# Patient Record
Sex: Male | Born: 1940 | Hispanic: No | Marital: Married
Health system: Southern US, Community
[De-identification: ages and names within clinical notes are randomized; demographics above are authoritative.]

---

## 2004-09-28 ENCOUNTER — Emergency Department: Payer: Self-pay | Admitting: Internal Medicine

## 2004-10-27 ENCOUNTER — Emergency Department: Payer: Self-pay | Admitting: Unknown Physician Specialty

## 2005-10-18 ENCOUNTER — Emergency Department: Payer: Self-pay | Admitting: Internal Medicine

## 2005-10-18 ENCOUNTER — Other Ambulatory Visit: Payer: Self-pay

## 2005-10-21 ENCOUNTER — Emergency Department: Payer: Self-pay | Admitting: Emergency Medicine

## 2005-11-02 ENCOUNTER — Emergency Department: Payer: Self-pay | Admitting: Emergency Medicine

## 2005-11-19 ENCOUNTER — Emergency Department: Payer: Self-pay | Admitting: Emergency Medicine

## 2005-12-18 ENCOUNTER — Emergency Department: Payer: Self-pay | Admitting: Emergency Medicine

## 2006-10-28 ENCOUNTER — Inpatient Hospital Stay: Payer: Self-pay | Admitting: Vascular Surgery

## 2007-04-10 ENCOUNTER — Emergency Department: Payer: Self-pay | Admitting: Emergency Medicine

## 2007-04-18 ENCOUNTER — Emergency Department: Payer: Self-pay | Admitting: Internal Medicine

## 2007-05-30 ENCOUNTER — Emergency Department: Payer: Self-pay | Admitting: Emergency Medicine

## 2007-09-25 ENCOUNTER — Other Ambulatory Visit: Payer: Self-pay

## 2007-09-25 ENCOUNTER — Emergency Department: Payer: Self-pay | Admitting: Emergency Medicine

## 2007-09-28 ENCOUNTER — Other Ambulatory Visit: Payer: Self-pay

## 2007-09-28 ENCOUNTER — Inpatient Hospital Stay: Payer: Self-pay | Admitting: Internal Medicine

## 2008-01-28 ENCOUNTER — Emergency Department: Payer: Self-pay | Admitting: Emergency Medicine

## 2008-02-03 ENCOUNTER — Emergency Department: Payer: Self-pay | Admitting: Emergency Medicine

## 2008-02-05 ENCOUNTER — Inpatient Hospital Stay: Payer: Self-pay | Admitting: Internal Medicine

## 2008-09-17 ENCOUNTER — Inpatient Hospital Stay: Payer: Self-pay | Admitting: Internal Medicine

## 2008-09-18 ENCOUNTER — Ambulatory Visit: Payer: Self-pay | Admitting: Cardiovascular Disease

## 2009-05-02 ENCOUNTER — Emergency Department: Payer: Self-pay | Admitting: Emergency Medicine

## 2009-05-13 ENCOUNTER — Emergency Department: Payer: Self-pay | Admitting: Internal Medicine

## 2009-05-29 ENCOUNTER — Emergency Department: Payer: Self-pay | Admitting: Emergency Medicine

## 2009-12-12 ENCOUNTER — Inpatient Hospital Stay: Payer: Self-pay | Admitting: Internal Medicine

## 2010-01-12 ENCOUNTER — Emergency Department: Payer: Self-pay | Admitting: Emergency Medicine

## 2010-02-27 ENCOUNTER — Emergency Department: Payer: Self-pay | Admitting: Emergency Medicine

## 2010-03-22 ENCOUNTER — Emergency Department: Payer: Self-pay | Admitting: Internal Medicine

## 2010-08-02 ENCOUNTER — Emergency Department: Payer: Self-pay | Admitting: Emergency Medicine

## 2010-08-21 ENCOUNTER — Emergency Department: Payer: Self-pay | Admitting: Emergency Medicine

## 2010-08-27 ENCOUNTER — Emergency Department: Payer: Self-pay | Admitting: Emergency Medicine

## 2011-03-05 ENCOUNTER — Emergency Department: Payer: Self-pay | Admitting: Emergency Medicine

## 2011-04-19 ENCOUNTER — Inpatient Hospital Stay: Payer: Self-pay | Admitting: Internal Medicine

## 2011-10-11 ENCOUNTER — Emergency Department: Payer: Self-pay | Admitting: Unknown Physician Specialty

## 2011-10-11 LAB — URINALYSIS, COMPLETE
Bacteria: NONE SEEN
Bilirubin,UR: NEGATIVE
Leukocyte Esterase: NEGATIVE
Ph: 5 (ref 4.5–8.0)
RBC,UR: 14 /HPF (ref 0–5)
Squamous Epithelial: <1
WBC UR: 1 /HPF (ref 0–5)

## 2011-10-11 LAB — CBC
HGB: 13.5 g/dL (ref 13.0–18.0)
MCH: 35.2 pg — ABNORMAL HIGH (ref 26.0–34.0)
RBC: 3.84 10*6/uL — ABNORMAL LOW (ref 4.40–5.90)
RDW: 12.2 % (ref 11.5–14.5)
WBC: 3.6 10*3/uL — ABNORMAL LOW (ref 3.8–10.6)

## 2011-10-11 LAB — COMPREHENSIVE METABOLIC PANEL
Alkaline Phosphatase: 55 U/L (ref 50–136)
BUN: 16 mg/dL (ref 7–18)
Bilirubin,Total: 1.1 mg/dL — ABNORMAL HIGH (ref 0.2–1.0)
Chloride: 93 mmol/L — ABNORMAL LOW (ref 98–107)
Co2: 29 mmol/L (ref 21–32)
Creatinine: 1.04 mg/dL (ref 0.60–1.30)
EGFR (African American): >60
EGFR (Non-African Amer.): >60
Osmolality: 268 (ref 275–301)
Sodium: 133 mmol/L — ABNORMAL LOW (ref 136–145)
Total Protein: 8.3 g/dL — ABNORMAL HIGH (ref 6.4–8.2)

## 2011-10-11 LAB — LIPASE, BLOOD: Lipase: 162 U/L (ref 73–393)

## 2012-01-08 ENCOUNTER — Ambulatory Visit: Payer: Self-pay | Admitting: Otolaryngology

## 2012-01-25 ENCOUNTER — Emergency Department: Payer: Self-pay | Admitting: Emergency Medicine

## 2012-01-25 LAB — BASIC METABOLIC PANEL
BUN: 12 mg/dL (ref 7–18)
Calcium, Total: 8.9 mg/dL (ref 8.5–10.1)
Co2: 31 mmol/L (ref 21–32)
EGFR (African American): >60
EGFR (Non-African Amer.): >60
Glucose: 81 mg/dL (ref 65–99)
Potassium: 2.6 mmol/L — ABNORMAL LOW (ref 3.5–5.1)

## 2012-01-25 LAB — CBC
HGB: 13.5 g/dL (ref 13.0–18.0)
MCHC: 34.9 g/dL (ref 32.0–36.0)
MCV: 99 fL (ref 80–100)
Platelet: 250 10*3/uL (ref 150–440)
RDW: 12.1 % (ref 11.5–14.5)
WBC: 3.2 10*3/uL — ABNORMAL LOW (ref 3.8–10.6)

## 2012-02-04 ENCOUNTER — Ambulatory Visit: Payer: Self-pay | Admitting: Otolaryngology

## 2012-02-04 DIAGNOSIS — I10 Essential (primary) hypertension: Secondary | ICD-10-CM

## 2012-02-04 LAB — ELECTROLYTE PANEL
Anion Gap: 8 (ref 7–16)
Chloride: 90 mmol/L — ABNORMAL LOW (ref 98–107)
Sodium: 130 mmol/L — ABNORMAL LOW (ref 136–145)

## 2012-02-15 ENCOUNTER — Ambulatory Visit: Payer: Self-pay | Admitting: Otolaryngology

## 2012-02-16 LAB — PATHOLOGY REPORT

## 2012-02-19 ENCOUNTER — Ambulatory Visit: Payer: Self-pay | Admitting: Otolaryngology

## 2012-02-23 ENCOUNTER — Ambulatory Visit: Payer: Self-pay | Admitting: Otolaryngology

## 2012-03-09 ENCOUNTER — Observation Stay: Payer: Self-pay | Admitting: Internal Medicine

## 2012-03-09 LAB — URINALYSIS, COMPLETE
Bacteria: NONE SEEN
Bilirubin,UR: NEGATIVE
Bilirubin,UR: NEGATIVE
Glucose,UR: NEGATIVE mg/dL (ref 0–75)
Nitrite: NEGATIVE
Nitrite: NEGATIVE
Ph: 7 (ref 4.5–8.0)
Protein: NEGATIVE
Squamous Epithelial: <1
Squamous Epithelial: <1
WBC UR: 11 /HPF (ref 0–5)
WBC UR: 91 /HPF (ref 0–5)

## 2012-03-09 LAB — CBC
HCT: 37.4 % — ABNORMAL LOW (ref 40.0–52.0)
HGB: 13 g/dL (ref 13.0–18.0)
MCH: 33.7 pg (ref 26.0–34.0)
MCHC: 34.7 g/dL (ref 32.0–36.0)
MCV: 97 fL (ref 80–100)
Platelet: 242 10*3/uL (ref 150–440)
RBC: 3.85 10*6/uL — ABNORMAL LOW (ref 4.40–5.90)
RDW: 13.1 % (ref 11.5–14.5)
WBC: 4.7 10*3/uL (ref 3.8–10.6)

## 2012-03-09 LAB — TSH: Thyroid Stimulating Horm: 4.47 u[IU]/mL

## 2012-03-09 LAB — COMPREHENSIVE METABOLIC PANEL
Alkaline Phosphatase: 84 U/L (ref 50–136)
BUN: 11 mg/dL (ref 7–18)
Bilirubin,Total: 1.1 mg/dL — ABNORMAL HIGH (ref 0.2–1.0)
Calcium, Total: 8.9 mg/dL (ref 8.5–10.1)
Chloride: 77 mmol/L — ABNORMAL LOW (ref 98–107)
Co2: 31 mmol/L (ref 21–32)
Creatinine: 1.06 mg/dL (ref 0.60–1.30)
EGFR (African American): >60
EGFR (Non-African Amer.): >60
Osmolality: 245 (ref 275–301)
Potassium: 2.5 mmol/L — CL (ref 3.5–5.1)
SGOT(AST): 56 U/L — ABNORMAL HIGH (ref 15–37)
Total Protein: 7.9 g/dL (ref 6.4–8.2)

## 2012-03-10 LAB — BASIC METABOLIC PANEL
Anion Gap: 6 — ABNORMAL LOW (ref 7–16)
Calcium, Total: 8.7 mg/dL (ref 8.5–10.1)
Chloride: 91 mmol/L — ABNORMAL LOW (ref 98–107)
Co2: 33 mmol/L — ABNORMAL HIGH (ref 21–32)
EGFR (African American): >60
EGFR (Non-African Amer.): >60
Osmolality: 260 (ref 275–301)
Potassium: 3.6 mmol/L (ref 3.5–5.1)
Sodium: 130 mmol/L — ABNORMAL LOW (ref 136–145)

## 2012-03-10 LAB — CBC WITH DIFFERENTIAL/PLATELET
Basophil %: 0.3 %
Eosinophil %: 0.7 %
HCT: 37.1 % — ABNORMAL LOW (ref 40.0–52.0)
HGB: 12.7 g/dL — ABNORMAL LOW (ref 13.0–18.0)
Lymphocyte #: 0.4 10*3/uL — ABNORMAL LOW (ref 1.0–3.6)
Lymphocyte %: 11.4 %
Monocyte #: 0.8 x10 3/mm (ref 0.2–1.0)
Monocyte %: 19.4 %
Neutrophil #: 2.7 10*3/uL (ref 1.4–6.5)
Neutrophil %: 68.2 %
Platelet: 230 10*3/uL (ref 150–440)
WBC: 3.9 10*3/uL (ref 3.8–10.6)

## 2012-03-10 LAB — MAGNESIUM: Magnesium: 1.9 mg/dL

## 2012-03-11 LAB — BASIC METABOLIC PANEL
Anion Gap: 7 (ref 7–16)
BUN: 14 mg/dL (ref 7–18)
Calcium, Total: 8.4 mg/dL — ABNORMAL LOW (ref 8.5–10.1)
Chloride: 95 mmol/L — ABNORMAL LOW (ref 98–107)
Co2: 30 mmol/L (ref 21–32)
EGFR (Non-African Amer.): >60
Glucose: 99 mg/dL (ref 65–99)
Osmolality: 265 (ref 275–301)
Potassium: 3.6 mmol/L (ref 3.5–5.1)
Sodium: 132 mmol/L — ABNORMAL LOW (ref 136–145)

## 2012-03-11 LAB — CBC WITH DIFFERENTIAL/PLATELET
Basophil %: 0.5 %
Eosinophil #: 0.1 10*3/uL (ref 0.0–0.7)
Eosinophil %: 1.2 %
HCT: 35.4 % — ABNORMAL LOW (ref 40.0–52.0)
Lymphocyte #: 0.6 10*3/uL — ABNORMAL LOW (ref 1.0–3.6)
Lymphocyte %: 14.4 %
MCHC: 34.6 g/dL (ref 32.0–36.0)
Monocyte #: 0.6 x10 3/mm (ref 0.2–1.0)
Neutrophil #: 2.9 10*3/uL (ref 1.4–6.5)
Neutrophil %: 68.8 %
Platelet: 206 10*3/uL (ref 150–440)
RDW: 13.5 % (ref 11.5–14.5)

## 2012-03-11 LAB — URINE CULTURE

## 2012-03-31 ENCOUNTER — Ambulatory Visit: Payer: Self-pay | Admitting: Radiation Oncology

## 2012-04-06 ENCOUNTER — Ambulatory Visit: Payer: Self-pay | Admitting: Gastroenterology

## 2012-04-08 ENCOUNTER — Emergency Department: Payer: Self-pay | Admitting: Emergency Medicine

## 2012-04-11 ENCOUNTER — Ambulatory Visit: Payer: Self-pay | Admitting: Vascular Surgery

## 2012-04-13 LAB — CBC CANCER CENTER
Basophil #: 0 x10 3/mm (ref 0.0–0.1)
Basophil %: 0.3 %
Eosinophil %: 1.4 %
HGB: 11.9 g/dL — ABNORMAL LOW (ref 13.0–18.0)
Lymphocyte #: 0.6 x10 3/mm — ABNORMAL LOW (ref 1.0–3.6)
MCH: 35.1 pg — ABNORMAL HIGH (ref 26.0–34.0)
MCHC: 34.6 g/dL (ref 32.0–36.0)
MCV: 101 fL — ABNORMAL HIGH (ref 80–100)
Monocyte #: 0.7 x10 3/mm (ref 0.2–1.0)
Platelet: 246 x10 3/mm (ref 150–440)
RBC: 3.39 10*6/uL — ABNORMAL LOW (ref 4.40–5.90)

## 2012-04-13 LAB — COMPREHENSIVE METABOLIC PANEL
Albumin: 3.4 g/dL (ref 3.4–5.0)
Alkaline Phosphatase: 83 U/L (ref 50–136)
BUN: 28 mg/dL — ABNORMAL HIGH (ref 7–18)
Bilirubin,Total: 0.6 mg/dL (ref 0.2–1.0)
Calcium, Total: 8.7 mg/dL (ref 8.5–10.1)
Chloride: 91 mmol/L — ABNORMAL LOW (ref 98–107)
Creatinine: 1.49 mg/dL — ABNORMAL HIGH (ref 0.60–1.30)
EGFR (Non-African Amer.): 46 — ABNORMAL LOW
Glucose: 130 mg/dL — ABNORMAL HIGH (ref 65–99)
Osmolality: 264 (ref 275–301)
Potassium: 3.1 mmol/L — ABNORMAL LOW (ref 3.5–5.1)
Sodium: 128 mmol/L — ABNORMAL LOW (ref 136–145)
Total Protein: 7.4 g/dL (ref 6.4–8.2)

## 2012-04-14 ENCOUNTER — Ambulatory Visit: Payer: Self-pay | Admitting: Radiation Oncology

## 2012-04-20 LAB — CBC CANCER CENTER
Basophil #: 0 x10 3/mm (ref 0.0–0.1)
Eosinophil #: 0.1 x10 3/mm (ref 0.0–0.7)
Eosinophil %: 2.2 %
Lymphocyte #: 0.5 x10 3/mm — ABNORMAL LOW (ref 1.0–3.6)
MCH: 35.3 pg — ABNORMAL HIGH (ref 26.0–34.0)
MCHC: 34.1 g/dL (ref 32.0–36.0)
Monocyte #: 0.9 x10 3/mm (ref 0.2–1.0)
Neutrophil #: 4.5 x10 3/mm (ref 1.4–6.5)
Neutrophil %: 74.3 %
Platelet: 273 x10 3/mm (ref 150–440)
RBC: 3.4 10*6/uL — ABNORMAL LOW (ref 4.40–5.90)
RDW: 13.5 % (ref 11.5–14.5)
WBC: 6.1 x10 3/mm (ref 3.8–10.6)

## 2012-04-20 LAB — COMPREHENSIVE METABOLIC PANEL
Albumin: 3.4 g/dL (ref 3.4–5.0)
Anion Gap: 7 (ref 7–16)
BUN: 21 mg/dL — ABNORMAL HIGH (ref 7–18)
Bilirubin,Total: 0.3 mg/dL (ref 0.2–1.0)
Chloride: 93 mmol/L — ABNORMAL LOW (ref 98–107)
Creatinine: 1.1 mg/dL (ref 0.60–1.30)
EGFR (African American): >60
EGFR (Non-African Amer.): >60
Glucose: 114 mg/dL — ABNORMAL HIGH (ref 65–99)
Potassium: 3.5 mmol/L (ref 3.5–5.1)
SGOT(AST): 40 U/L — ABNORMAL HIGH (ref 15–37)
Sodium: 129 mmol/L — ABNORMAL LOW (ref 136–145)
Total Protein: 7.2 g/dL (ref 6.4–8.2)

## 2012-05-03 LAB — COMPREHENSIVE METABOLIC PANEL
Albumin: 3.2 g/dL — ABNORMAL LOW (ref 3.4–5.0)
Anion Gap: 8 (ref 7–16)
BUN: 21 mg/dL — ABNORMAL HIGH (ref 7–18)
Bilirubin,Total: 0.3 mg/dL (ref 0.2–1.0)
Calcium, Total: 8.5 mg/dL (ref 8.5–10.1)
Chloride: 91 mmol/L — ABNORMAL LOW (ref 98–107)
Creatinine: 1.17 mg/dL (ref 0.60–1.30)
EGFR (African American): >60
Glucose: 100 mg/dL — ABNORMAL HIGH (ref 65–99)
Osmolality: 264 (ref 275–301)
Potassium: 3.3 mmol/L — ABNORMAL LOW (ref 3.5–5.1)
SGOT(AST): 60 U/L — ABNORMAL HIGH (ref 15–37)
Sodium: 130 mmol/L — ABNORMAL LOW (ref 136–145)
Total Protein: 7.1 g/dL (ref 6.4–8.2)

## 2012-05-03 LAB — CBC CANCER CENTER
Basophil %: 0.5 %
Eosinophil %: 0.6 %
HCT: 32.9 % — ABNORMAL LOW (ref 40.0–52.0)
HGB: 11 g/dL — ABNORMAL LOW (ref 13.0–18.0)
Lymphocyte #: 0.5 x10 3/mm — ABNORMAL LOW (ref 1.0–3.6)
MCV: 103 fL — ABNORMAL HIGH (ref 80–100)
Monocyte %: 15.7 %
Neutrophil #: 4.1 x10 3/mm (ref 1.4–6.5)
RDW: 13.3 % (ref 11.5–14.5)
WBC: 5.5 x10 3/mm (ref 3.8–10.6)

## 2012-05-10 LAB — CBC CANCER CENTER
Eosinophil #: 0 x10 3/mm (ref 0.0–0.7)
Eosinophil %: 0.7 %
Lymphocyte %: 10.5 %
Monocyte #: 0.7 x10 3/mm (ref 0.2–1.0)
Monocyte %: 16.7 %
Neutrophil #: 3.1 x10 3/mm (ref 1.4–6.5)
Neutrophil %: 71.7 %
Platelet: 264 x10 3/mm (ref 150–440)
RBC: 3.44 10*6/uL — ABNORMAL LOW (ref 4.40–5.90)
RDW: 13.4 % (ref 11.5–14.5)
WBC: 4.3 x10 3/mm (ref 3.8–10.6)

## 2012-05-10 LAB — COMPREHENSIVE METABOLIC PANEL
Albumin: 3.4 g/dL (ref 3.4–5.0)
Alkaline Phosphatase: 75 U/L (ref 50–136)
Bilirubin,Total: 0.6 mg/dL (ref 0.2–1.0)
Calcium, Total: 8.5 mg/dL (ref 8.5–10.1)
Chloride: 93 mmol/L — ABNORMAL LOW (ref 98–107)
Creatinine: 0.98 mg/dL (ref 0.60–1.30)
EGFR (Non-African Amer.): >60
Glucose: 93 mg/dL (ref 65–99)
Osmolality: 263 (ref 275–301)
Potassium: 3.4 mmol/L — ABNORMAL LOW (ref 3.5–5.1)
Sodium: 130 mmol/L — ABNORMAL LOW (ref 136–145)
Total Protein: 7.3 g/dL (ref 6.4–8.2)

## 2012-05-15 ENCOUNTER — Ambulatory Visit: Payer: Self-pay | Admitting: Radiation Oncology

## 2012-05-19 LAB — COMPREHENSIVE METABOLIC PANEL
Albumin: 3.4 g/dL (ref 3.4–5.0)
Alkaline Phosphatase: 92 U/L (ref 50–136)
Anion Gap: 6 — ABNORMAL LOW (ref 7–16)
BUN: 19 mg/dL — ABNORMAL HIGH (ref 7–18)
Bilirubin,Total: 0.3 mg/dL (ref 0.2–1.0)
Calcium, Total: 8.8 mg/dL (ref 8.5–10.1)
Chloride: 87 mmol/L — ABNORMAL LOW (ref 98–107)
Co2: 32 mmol/L (ref 21–32)
Creatinine: 1.03 mg/dL (ref 0.60–1.30)
EGFR (Non-African Amer.): >60
Glucose: 99 mg/dL (ref 65–99)
Osmolality: 254 (ref 275–301)
Potassium: 3.1 mmol/L — ABNORMAL LOW (ref 3.5–5.1)
SGPT (ALT): 49 U/L (ref 12–78)
Sodium: 125 mmol/L — ABNORMAL LOW (ref 136–145)
Total Protein: 7.3 g/dL (ref 6.4–8.2)

## 2012-05-19 LAB — CBC CANCER CENTER
Basophil #: 0 x10 3/mm (ref 0.0–0.1)
Eosinophil %: 0.3 %
Lymphocyte #: 0.5 x10 3/mm — ABNORMAL LOW (ref 1.0–3.6)
Lymphocyte %: 13.5 %
MCH: 34.1 pg — ABNORMAL HIGH (ref 26.0–34.0)
MCV: 101 fL — ABNORMAL HIGH (ref 80–100)
Monocyte #: 0.8 x10 3/mm (ref 0.2–1.0)
Neutrophil %: 63.9 %
Platelet: 224 x10 3/mm (ref 150–440)
RBC: 3.36 10*6/uL — ABNORMAL LOW (ref 4.40–5.90)
RDW: 13.3 % (ref 11.5–14.5)
WBC: 3.5 x10 3/mm — ABNORMAL LOW (ref 3.8–10.6)

## 2012-05-19 LAB — MAGNESIUM: Magnesium: 1.9 mg/dL

## 2012-05-26 LAB — CBC CANCER CENTER
Basophil #: 0 x10 3/mm (ref 0.0–0.1)
Eosinophil #: 0 x10 3/mm (ref 0.0–0.7)
HCT: 32.1 % — ABNORMAL LOW (ref 40.0–52.0)
Lymphocyte #: 0.4 x10 3/mm — ABNORMAL LOW (ref 1.0–3.6)
MCH: 34.5 pg — ABNORMAL HIGH (ref 26.0–34.0)
MCHC: 33.9 g/dL (ref 32.0–36.0)
MCV: 102 fL — ABNORMAL HIGH (ref 80–100)
Monocyte #: 0.6 x10 3/mm (ref 0.2–1.0)
Monocyte %: 23.1 %
Neutrophil #: 1.7 x10 3/mm (ref 1.4–6.5)
Platelet: 188 x10 3/mm (ref 150–440)
RDW: 13.8 % (ref 11.5–14.5)

## 2012-05-26 LAB — COMPREHENSIVE METABOLIC PANEL
BUN: 17 mg/dL (ref 7–18)
Bilirubin,Total: 0.2 mg/dL (ref 0.2–1.0)
Calcium, Total: 8.3 mg/dL — ABNORMAL LOW (ref 8.5–10.1)
Chloride: 94 mmol/L — ABNORMAL LOW (ref 98–107)
EGFR (Non-African Amer.): >60
Glucose: 106 mg/dL — ABNORMAL HIGH (ref 65–99)
Osmolality: 265 (ref 275–301)
Potassium: 3 mmol/L — ABNORMAL LOW (ref 3.5–5.1)
SGPT (ALT): 38 U/L (ref 12–78)
Total Protein: 6.7 g/dL (ref 6.4–8.2)

## 2012-06-08 ENCOUNTER — Emergency Department: Payer: Self-pay | Admitting: Emergency Medicine

## 2012-06-08 LAB — BASIC METABOLIC PANEL
BUN: 14 mg/dL (ref 7–18)
Chloride: 97 mmol/L — ABNORMAL LOW (ref 98–107)
Creatinine: 0.97 mg/dL (ref 0.60–1.30)
EGFR (Non-African Amer.): >60
Glucose: 129 mg/dL — ABNORMAL HIGH (ref 65–99)
Osmolality: 272 (ref 275–301)
Potassium: 3.9 mmol/L (ref 3.5–5.1)

## 2012-06-08 LAB — CBC
MCH: 35.7 pg — ABNORMAL HIGH (ref 26.0–34.0)
MCHC: 34.8 g/dL (ref 32.0–36.0)
MCV: 102 fL — ABNORMAL HIGH (ref 80–100)
Platelet: 238 10*3/uL (ref 150–440)
RBC: 3.45 10*6/uL — ABNORMAL LOW (ref 4.40–5.90)

## 2012-06-08 LAB — TSH: Thyroid Stimulating Horm: 43.2 u[IU]/mL — ABNORMAL HIGH

## 2012-06-14 ENCOUNTER — Ambulatory Visit: Payer: Self-pay | Admitting: Radiation Oncology

## 2012-06-19 ENCOUNTER — Emergency Department: Payer: Self-pay | Admitting: Unknown Physician Specialty

## 2012-06-20 ENCOUNTER — Emergency Department: Payer: Self-pay | Admitting: Emergency Medicine

## 2012-06-23 LAB — CBC CANCER CENTER
Basophil #: 0 x10 3/mm (ref 0.0–0.1)
Basophil %: 0.4 %
Eosinophil #: 0 x10 3/mm (ref 0.0–0.7)
Eosinophil %: 0.9 %
HCT: 37.3 % — ABNORMAL LOW (ref 40.0–52.0)
HGB: 12.2 g/dL — ABNORMAL LOW (ref 13.0–18.0)
Lymphocyte #: 0.5 x10 3/mm — ABNORMAL LOW (ref 1.0–3.6)
Lymphocyte %: 15.3 %
MCH: 34.1 pg — ABNORMAL HIGH (ref 26.0–34.0)
MCV: 104 fL — ABNORMAL HIGH (ref 80–100)
Monocyte #: 0.6 x10 3/mm (ref 0.2–1.0)
Monocyte %: 17.3 %
Neutrophil #: 2.3 x10 3/mm (ref 1.4–6.5)
WBC: 3.5 x10 3/mm — ABNORMAL LOW (ref 3.8–10.6)

## 2012-06-23 LAB — COMPREHENSIVE METABOLIC PANEL
Alkaline Phosphatase: 67 U/L (ref 50–136)
Anion Gap: 6 — ABNORMAL LOW (ref 7–16)
BUN: 14 mg/dL (ref 7–18)
Bilirubin,Total: 0.2 mg/dL (ref 0.2–1.0)
Calcium, Total: 8.9 mg/dL (ref 8.5–10.1)
Chloride: 100 mmol/L (ref 98–107)
EGFR (African American): >60
EGFR (Non-African Amer.): >60
Glucose: 87 mg/dL (ref 65–99)
Potassium: 4.2 mmol/L (ref 3.5–5.1)
SGOT(AST): 71 U/L — ABNORMAL HIGH (ref 15–37)
SGPT (ALT): 63 U/L (ref 12–78)
Sodium: 137 mmol/L (ref 136–145)

## 2012-07-10 ENCOUNTER — Emergency Department: Payer: Self-pay | Admitting: *Deleted

## 2012-07-15 ENCOUNTER — Emergency Department: Payer: Self-pay | Admitting: Emergency Medicine

## 2012-07-15 ENCOUNTER — Ambulatory Visit: Payer: Self-pay | Admitting: Radiation Oncology

## 2012-07-15 LAB — COMPREHENSIVE METABOLIC PANEL
Albumin: 4.6 g/dL (ref 3.4–5.0)
Alkaline Phosphatase: 81 U/L (ref 50–136)
BUN: 10 mg/dL (ref 7–18)
Co2: 28 mmol/L (ref 21–32)
Creatinine: 1.01 mg/dL (ref 0.60–1.30)
Glucose: 125 mg/dL — ABNORMAL HIGH (ref 65–99)
SGPT (ALT): 66 U/L (ref 12–78)

## 2012-07-15 LAB — CBC
HCT: 41.9 % (ref 40.0–52.0)
MCHC: 33.9 g/dL (ref 32.0–36.0)
Platelet: 255 10*3/uL (ref 150–440)
RDW: 16 % — ABNORMAL HIGH (ref 11.5–14.5)
WBC: 6.1 10*3/uL (ref 3.8–10.6)

## 2012-07-17 ENCOUNTER — Ambulatory Visit: Payer: Self-pay | Admitting: Radiation Oncology

## 2012-07-21 ENCOUNTER — Emergency Department: Payer: Self-pay | Admitting: Emergency Medicine

## 2012-08-01 ENCOUNTER — Emergency Department: Payer: Self-pay | Admitting: Emergency Medicine

## 2012-08-03 ENCOUNTER — Observation Stay: Payer: Self-pay

## 2012-08-03 LAB — CBC
MCHC: 33.4 g/dL (ref 32.0–36.0)
RDW: 15.2 % — ABNORMAL HIGH (ref 11.5–14.5)
WBC: 8 10*3/uL (ref 3.8–10.6)

## 2012-08-03 LAB — BASIC METABOLIC PANEL
Co2: 28 mmol/L (ref 21–32)
Creatinine: 1.13 mg/dL (ref 0.60–1.30)
EGFR (African American): >60
EGFR (Non-African Amer.): >60
Glucose: 109 mg/dL — ABNORMAL HIGH (ref 65–99)
Osmolality: 270 (ref 275–301)
Potassium: 3.9 mmol/L (ref 3.5–5.1)
Sodium: 132 mmol/L — ABNORMAL LOW (ref 136–145)

## 2012-08-03 LAB — CK TOTAL AND CKMB (NOT AT ARMC): CK, Total: 1523 U/L — ABNORMAL HIGH (ref 35–232)

## 2012-08-04 LAB — CBC WITH DIFFERENTIAL/PLATELET
Basophil #: 0 10*3/uL (ref 0.0–0.1)
Basophil %: 0.2 %
Eosinophil #: 0 10*3/uL (ref 0.0–0.7)
Eosinophil %: 0.4 %
HCT: 35.7 % — ABNORMAL LOW (ref 40.0–52.0)
HGB: 12.4 g/dL — ABNORMAL LOW (ref 13.0–18.0)
MCH: 34.5 pg — ABNORMAL HIGH (ref 26.0–34.0)
MCHC: 34.7 g/dL (ref 32.0–36.0)
Monocyte #: 0.7 x10 3/mm (ref 0.2–1.0)
Monocyte %: 13.6 %
Neutrophil #: 4.4 10*3/uL (ref 1.4–6.5)
Neutrophil %: 79.4 %
WBC: 5.5 10*3/uL (ref 3.8–10.6)

## 2012-08-04 LAB — BASIC METABOLIC PANEL
Anion Gap: 5 — ABNORMAL LOW (ref 7–16)
BUN: 20 mg/dL — ABNORMAL HIGH (ref 7–18)
Calcium, Total: 8.3 mg/dL — ABNORMAL LOW (ref 8.5–10.1)
Chloride: 102 mmol/L (ref 98–107)
Co2: 29 mmol/L (ref 21–32)
Creatinine: 0.86 mg/dL (ref 0.60–1.30)
EGFR (African American): >60
Osmolality: 275 (ref 275–301)
Potassium: 3.2 mmol/L — ABNORMAL LOW (ref 3.5–5.1)

## 2012-08-04 LAB — CK: CK, Total: 1691 U/L — ABNORMAL HIGH (ref 35–232)

## 2012-08-04 LAB — CK TOTAL AND CKMB (NOT AT ARMC)
CK, Total: 1708 U/L — ABNORMAL HIGH (ref 35–232)
CK-MB: 8.2 ng/mL — ABNORMAL HIGH (ref 0.5–3.6)

## 2012-08-04 LAB — LIPID PANEL
Triglycerides: 44 mg/dL (ref 0–200)
VLDL Cholesterol, Calc: 9 mg/dL (ref 5–40)

## 2012-08-04 LAB — HEMOGLOBIN A1C: Hemoglobin A1C: 5.6 % (ref 4.2–6.3)

## 2012-08-04 LAB — TROPONIN I: Troponin-I: 0.11 ng/mL — ABNORMAL HIGH

## 2012-08-09 LAB — CULTURE, BLOOD (SINGLE)

## 2012-08-14 ENCOUNTER — Ambulatory Visit: Payer: Self-pay | Admitting: Radiation Oncology

## 2012-08-25 ENCOUNTER — Ambulatory Visit: Payer: Self-pay | Admitting: Oncology

## 2012-09-06 ENCOUNTER — Emergency Department: Payer: Self-pay | Admitting: Emergency Medicine

## 2012-09-06 LAB — CBC WITH DIFFERENTIAL/PLATELET
Basophil %: 0.7 %
Eosinophil %: 1.8 %
HCT: 39.7 % — ABNORMAL LOW (ref 40.0–52.0)
HGB: 13.2 g/dL (ref 13.0–18.0)
Lymphocyte #: 0.8 10*3/uL — ABNORMAL LOW (ref 1.0–3.6)
Monocyte #: 0.7 x10 3/mm (ref 0.2–1.0)
Monocyte %: 11.2 %
Neutrophil #: 4.5 10*3/uL (ref 1.4–6.5)
RBC: 4.05 10*6/uL — ABNORMAL LOW (ref 4.40–5.90)
RDW: 14.5 % (ref 11.5–14.5)
WBC: 6.1 10*3/uL (ref 3.8–10.6)

## 2012-09-06 LAB — COMPREHENSIVE METABOLIC PANEL
Anion Gap: 6 — ABNORMAL LOW (ref 7–16)
BUN: 23 mg/dL — ABNORMAL HIGH (ref 7–18)
Calcium, Total: 9.3 mg/dL (ref 8.5–10.1)
Chloride: 104 mmol/L (ref 98–107)
Creatinine: 0.98 mg/dL (ref 0.60–1.30)
EGFR (African American): >60
Potassium: 4.2 mmol/L (ref 3.5–5.1)
SGOT(AST): 32 U/L (ref 15–37)
SGPT (ALT): 43 U/L (ref 12–78)
Sodium: 137 mmol/L (ref 136–145)

## 2012-09-14 ENCOUNTER — Ambulatory Visit: Payer: Self-pay | Admitting: Radiation Oncology

## 2012-09-22 ENCOUNTER — Emergency Department: Payer: Self-pay | Admitting: Unknown Physician Specialty

## 2012-09-22 LAB — BASIC METABOLIC PANEL
BUN: 19 mg/dL — ABNORMAL HIGH (ref 7–18)
Chloride: 99 mmol/L (ref 98–107)
Co2: 34 mmol/L — ABNORMAL HIGH (ref 21–32)
Creatinine: 1.12 mg/dL (ref 0.60–1.30)
EGFR (African American): >60
EGFR (Non-African Amer.): >60
Osmolality: 278 (ref 275–301)

## 2012-09-22 LAB — CBC
HGB: 11.5 g/dL — ABNORMAL LOW (ref 13.0–18.0)
MCHC: 31.1 g/dL — ABNORMAL LOW (ref 32.0–36.0)
MCV: 97 fL (ref 80–100)
RBC: 3.81 10*6/uL — ABNORMAL LOW (ref 4.40–5.90)
RDW: 14.8 % — ABNORMAL HIGH (ref 11.5–14.5)

## 2012-09-22 LAB — TROPONIN I: Troponin-I: 0.1 ng/mL — ABNORMAL HIGH

## 2012-09-22 LAB — CK TOTAL AND CKMB (NOT AT ARMC): CK-MB: 6.5 ng/mL — ABNORMAL HIGH (ref 0.5–3.6)

## 2012-09-25 ENCOUNTER — Inpatient Hospital Stay: Payer: Self-pay

## 2012-09-25 LAB — CBC
HCT: 38.2 % — ABNORMAL LOW (ref 40.0–52.0)
HGB: 12 g/dL — ABNORMAL LOW (ref 13.0–18.0)
MCHC: 31.5 g/dL — ABNORMAL LOW (ref 32.0–36.0)
RDW: 14.6 % — ABNORMAL HIGH (ref 11.5–14.5)
WBC: 5.9 10*3/uL (ref 3.8–10.6)

## 2012-09-25 LAB — BASIC METABOLIC PANEL
BUN: 20 mg/dL — ABNORMAL HIGH (ref 7–18)
EGFR (African American): >60
Glucose: 99 mg/dL (ref 65–99)

## 2012-09-25 LAB — TROPONIN I: Troponin-I: 0.1 ng/mL — ABNORMAL HIGH

## 2012-09-25 LAB — CK TOTAL AND CKMB (NOT AT ARMC): CK-MB: 5.7 ng/mL — ABNORMAL HIGH (ref 0.5–3.6)

## 2012-09-26 LAB — CBC WITH DIFFERENTIAL/PLATELET
Basophil #: 0 10*3/uL (ref 0.0–0.1)
Basophil %: 0.4 %
Eosinophil %: 1.9 %
HGB: 12.5 g/dL — ABNORMAL LOW (ref 13.0–18.0)
Lymphocyte #: 0.4 10*3/uL — ABNORMAL LOW (ref 1.0–3.6)
Lymphocyte %: 9.3 %
MCH: 32 pg (ref 26.0–34.0)
MCHC: 33.9 g/dL (ref 32.0–36.0)
Monocyte %: 16.9 %
WBC: 4.1 10*3/uL (ref 3.8–10.6)

## 2012-09-26 LAB — COMPREHENSIVE METABOLIC PANEL
Alkaline Phosphatase: 92 U/L (ref 50–136)
Anion Gap: 7 (ref 7–16)
BUN: 14 mg/dL (ref 7–18)
Calcium, Total: 8.8 mg/dL (ref 8.5–10.1)
Chloride: 104 mmol/L (ref 98–107)
Co2: 26 mmol/L (ref 21–32)
Creatinine: 0.9 mg/dL (ref 0.60–1.30)
EGFR (Non-African Amer.): >60
Glucose: 91 mg/dL (ref 65–99)
Sodium: 137 mmol/L (ref 136–145)
Total Protein: 8 g/dL (ref 6.4–8.2)

## 2012-09-27 ENCOUNTER — Ambulatory Visit: Payer: Self-pay | Admitting: Oncology

## 2012-10-06 LAB — COMPREHENSIVE METABOLIC PANEL
Bilirubin,Total: 0.2 mg/dL (ref 0.2–1.0)
Calcium, Total: 8.5 mg/dL (ref 8.5–10.1)
Chloride: 96 mmol/L — ABNORMAL LOW (ref 98–107)
Creatinine: 1.05 mg/dL (ref 0.60–1.30)
EGFR (African American): >60
Osmolality: 273 (ref 275–301)
SGOT(AST): 27 U/L (ref 15–37)
Sodium: 135 mmol/L — ABNORMAL LOW (ref 136–145)
Total Protein: 7.4 g/dL (ref 6.4–8.2)

## 2012-10-06 LAB — CBC CANCER CENTER
Basophil #: 0 x10 3/mm (ref 0.0–0.1)
Eosinophil #: 0.1 x10 3/mm (ref 0.0–0.7)
HCT: 35.1 % — ABNORMAL LOW (ref 40.0–52.0)
HGB: 11.7 g/dL — ABNORMAL LOW (ref 13.0–18.0)
Lymphocyte #: 0.6 x10 3/mm — ABNORMAL LOW (ref 1.0–3.6)
Lymphocyte %: 12.1 %
MCHC: 33.4 g/dL (ref 32.0–36.0)
MCV: 94 fL (ref 80–100)
Monocyte #: 0.9 x10 3/mm (ref 0.2–1.0)
Monocyte %: 18.3 %
Neutrophil #: 3.3 x10 3/mm (ref 1.4–6.5)
Neutrophil %: 66.6 %
Platelet: 254 x10 3/mm (ref 150–440)
RBC: 3.74 10*6/uL — ABNORMAL LOW (ref 4.40–5.90)

## 2012-10-15 ENCOUNTER — Ambulatory Visit: Payer: Self-pay | Admitting: Radiation Oncology

## 2012-10-17 ENCOUNTER — Emergency Department: Payer: Self-pay | Admitting: Emergency Medicine

## 2012-10-24 ENCOUNTER — Ambulatory Visit: Payer: Self-pay | Admitting: Oncology

## 2012-10-28 ENCOUNTER — Emergency Department: Payer: Self-pay | Admitting: Emergency Medicine

## 2012-10-28 LAB — BASIC METABOLIC PANEL
BUN: 22 mg/dL — ABNORMAL HIGH (ref 7–18)
Calcium, Total: 9.1 mg/dL (ref 8.5–10.1)
Chloride: 102 mmol/L (ref 98–107)
Co2: 28 mmol/L (ref 21–32)
EGFR (African American): >60
EGFR (Non-African Amer.): >60
Glucose: 94 mg/dL (ref 65–99)
Osmolality: 273 (ref 275–301)
Sodium: 135 mmol/L — ABNORMAL LOW (ref 136–145)

## 2012-10-28 LAB — CBC
HCT: 37.4 % — ABNORMAL LOW (ref 40.0–52.0)
HGB: 12.1 g/dL — ABNORMAL LOW (ref 13.0–18.0)
MCH: 30.3 pg (ref 26.0–34.0)
Platelet: 259 10*3/uL (ref 150–440)
RBC: 4.01 10*6/uL — ABNORMAL LOW (ref 4.40–5.90)
RDW: 14.9 % — ABNORMAL HIGH (ref 11.5–14.5)

## 2012-10-30 ENCOUNTER — Emergency Department: Payer: Self-pay | Admitting: Emergency Medicine

## 2012-10-30 LAB — COMPREHENSIVE METABOLIC PANEL
Albumin: 3.5 g/dL (ref 3.4–5.0)
Alkaline Phosphatase: 100 U/L (ref 50–136)
Anion Gap: 6 — ABNORMAL LOW (ref 7–16)
BUN: 28 mg/dL — ABNORMAL HIGH (ref 7–18)
Bilirubin,Total: 0.3 mg/dL (ref 0.2–1.0)
Calcium, Total: 9 mg/dL (ref 8.5–10.1)
Chloride: 104 mmol/L (ref 98–107)
Co2: 29 mmol/L (ref 21–32)
Creatinine: 0.97 mg/dL (ref 0.60–1.30)
EGFR (African American): >60
EGFR (Non-African Amer.): >60
Glucose: 99 mg/dL (ref 65–99)
Osmolality: 283 (ref 275–301)
Potassium: 3.9 mmol/L (ref 3.5–5.1)
SGPT (ALT): 40 U/L (ref 12–78)
Sodium: 139 mmol/L (ref 136–145)
Total Protein: 8.2 g/dL (ref 6.4–8.2)

## 2012-10-30 LAB — CBC
HGB: 11.7 g/dL — ABNORMAL LOW (ref 13.0–18.0)
MCHC: 32.2 g/dL (ref 32.0–36.0)
Platelet: 280 10*3/uL (ref 150–440)
RBC: 3.91 10*6/uL — ABNORMAL LOW (ref 4.40–5.90)
WBC: 6.2 10*3/uL (ref 3.8–10.6)

## 2012-11-08 ENCOUNTER — Emergency Department: Payer: Self-pay | Admitting: Internal Medicine

## 2012-11-09 ENCOUNTER — Emergency Department: Payer: Self-pay | Admitting: Emergency Medicine

## 2012-11-12 ENCOUNTER — Ambulatory Visit: Payer: Self-pay | Admitting: Radiation Oncology

## 2012-11-14 ENCOUNTER — Ambulatory Visit: Payer: Self-pay | Admitting: Oncology

## 2012-11-17 ENCOUNTER — Ambulatory Visit: Payer: Self-pay | Admitting: Radiation Oncology

## 2012-11-27 ENCOUNTER — Emergency Department: Payer: Self-pay | Admitting: Unknown Physician Specialty

## 2012-11-27 LAB — COMPREHENSIVE METABOLIC PANEL
Albumin: 3.2 g/dL — ABNORMAL LOW (ref 3.4–5.0)
Alkaline Phosphatase: 89 U/L (ref 50–136)
Anion Gap: 6 — ABNORMAL LOW (ref 7–16)
BUN: 39 mg/dL — ABNORMAL HIGH (ref 7–18)
Bilirubin,Total: 0.4 mg/dL (ref 0.2–1.0)
Calcium, Total: 8.2 mg/dL — ABNORMAL LOW (ref 8.5–10.1)
Chloride: 97 mmol/L — ABNORMAL LOW (ref 98–107)
Co2: 29 mmol/L (ref 21–32)
Creatinine: 1.02 mg/dL (ref 0.60–1.30)
EGFR (African American): >60
Glucose: 137 mg/dL — ABNORMAL HIGH (ref 65–99)
Osmolality: 276 (ref 275–301)
Potassium: 4.2 mmol/L (ref 3.5–5.1)
SGPT (ALT): 66 U/L (ref 12–78)
Total Protein: 7 g/dL (ref 6.4–8.2)

## 2012-11-27 LAB — LIPASE, BLOOD: Lipase: 220 U/L (ref 73–393)

## 2012-11-27 LAB — CBC
HCT: 36.4 % — ABNORMAL LOW (ref 40.0–52.0)
HGB: 11.9 g/dL — ABNORMAL LOW (ref 13.0–18.0)
MCV: 92 fL (ref 80–100)
Platelet: 240 10*3/uL (ref 150–440)
RBC: 3.96 10*6/uL — ABNORMAL LOW (ref 4.40–5.90)
RDW: 16.4 % — ABNORMAL HIGH (ref 11.5–14.5)
WBC: 17.7 10*3/uL — ABNORMAL HIGH (ref 3.8–10.6)

## 2012-11-27 LAB — URINALYSIS, COMPLETE
Bilirubin,UR: NEGATIVE
Blood: NEGATIVE
Leukocyte Esterase: NEGATIVE
Nitrite: NEGATIVE
Ph: 8 (ref 4.5–8.0)
Protein: NEGATIVE
Specific Gravity: 1.01 (ref 1.003–1.030)
Squamous Epithelial: <1
WBC UR: NONE SEEN /HPF (ref 0–5)

## 2012-11-27 LAB — TROPONIN I
Troponin-I: 0.1 ng/mL — ABNORMAL HIGH
Troponin-I: 0.11 ng/mL — ABNORMAL HIGH

## 2012-12-05 ENCOUNTER — Ambulatory Visit: Payer: Self-pay | Admitting: Otolaryngology

## 2012-12-05 LAB — COMPREHENSIVE METABOLIC PANEL
Albumin: 3 g/dL — ABNORMAL LOW (ref 3.4–5.0)
BUN: 34 mg/dL — ABNORMAL HIGH (ref 7–18)
Bilirubin,Total: 0.4 mg/dL (ref 0.2–1.0)
Calcium, Total: 8.2 mg/dL — ABNORMAL LOW (ref 8.5–10.1)
Chloride: 97 mmol/L — ABNORMAL LOW (ref 98–107)
EGFR (African American): >60
EGFR (Non-African Amer.): 56 — ABNORMAL LOW
Glucose: 131 mg/dL — ABNORMAL HIGH (ref 65–99)
Potassium: 4.6 mmol/L (ref 3.5–5.1)
SGOT(AST): 18 U/L (ref 15–37)
Total Protein: 6.4 g/dL (ref 6.4–8.2)

## 2012-12-05 LAB — CBC CANCER CENTER
Basophil #: 0 x10 3/mm (ref 0.0–0.1)
Eosinophil %: 0 %
Lymphocyte #: 0.2 x10 3/mm — ABNORMAL LOW (ref 1.0–3.6)
Lymphocyte %: 1.6 %
MCHC: 32.8 g/dL (ref 32.0–36.0)
MCV: 92 fL (ref 80–100)
Monocyte #: 0.7 x10 3/mm (ref 0.2–1.0)
Neutrophil #: 13.4 x10 3/mm — ABNORMAL HIGH (ref 1.4–6.5)
Neutrophil %: 93.7 %
RBC: 4.15 10*6/uL — ABNORMAL LOW (ref 4.40–5.90)
RDW: 17.6 % — ABNORMAL HIGH (ref 11.5–14.5)

## 2012-12-06 LAB — PATHOLOGY REPORT

## 2012-12-10 LAB — CBC
HGB: 12.8 g/dL — ABNORMAL LOW (ref 13.0–18.0)
MCH: 30.8 pg (ref 26.0–34.0)
MCHC: 32.9 g/dL (ref 32.0–36.0)
MCV: 94 fL (ref 80–100)
RBC: 4.16 10*6/uL — ABNORMAL LOW (ref 4.40–5.90)
RDW: 18.6 % — ABNORMAL HIGH (ref 11.5–14.5)

## 2012-12-10 LAB — COMPREHENSIVE METABOLIC PANEL
Anion Gap: 15 (ref 7–16)
BUN: 27 mg/dL — ABNORMAL HIGH (ref 7–18)
Bilirubin,Total: 0.5 mg/dL (ref 0.2–1.0)
Calcium, Total: 8.2 mg/dL — ABNORMAL LOW (ref 8.5–10.1)
Co2: 20 mmol/L — ABNORMAL LOW (ref 21–32)
Glucose: 189 mg/dL — ABNORMAL HIGH (ref 65–99)
Potassium: 4.4 mmol/L (ref 3.5–5.1)
Sodium: 126 mmol/L — ABNORMAL LOW (ref 136–145)
Total Protein: 6.2 g/dL — ABNORMAL LOW (ref 6.4–8.2)

## 2012-12-10 LAB — PRO B NATRIURETIC PEPTIDE: B-Type Natriuretic Peptide: 85 pg/mL (ref 0–125)

## 2012-12-11 ENCOUNTER — Observation Stay: Payer: Self-pay

## 2012-12-12 LAB — BASIC METABOLIC PANEL
Anion Gap: 5 — ABNORMAL LOW (ref 7–16)
BUN: 42 mg/dL — ABNORMAL HIGH (ref 7–18)
Calcium, Total: 7.8 mg/dL — ABNORMAL LOW (ref 8.5–10.1)
Chloride: 95 mmol/L — ABNORMAL LOW (ref 98–107)
Co2: 33 mmol/L — ABNORMAL HIGH (ref 21–32)
EGFR (African American): >60
EGFR (Non-African Amer.): >60
Glucose: 151 mg/dL — ABNORMAL HIGH (ref 65–99)
Potassium: 3.9 mmol/L (ref 3.5–5.1)
Sodium: 133 mmol/L — ABNORMAL LOW (ref 136–145)

## 2012-12-12 LAB — CBC WITH DIFFERENTIAL/PLATELET
Basophil %: 0.2 %
Eosinophil #: 0 10*3/uL (ref 0.0–0.7)
Eosinophil %: 0 %
HCT: 38.1 % — ABNORMAL LOW (ref 40.0–52.0)
HGB: 12.8 g/dL — ABNORMAL LOW (ref 13.0–18.0)
Lymphocyte #: 0.1 10*3/uL — ABNORMAL LOW (ref 1.0–3.6)
Lymphocyte %: 1.1 %
MCHC: 33.5 g/dL (ref 32.0–36.0)
MCV: 93 fL (ref 80–100)
Monocyte %: 5.5 %
Neutrophil #: 10.9 10*3/uL — ABNORMAL HIGH (ref 1.4–6.5)
Platelet: 129 10*3/uL — ABNORMAL LOW (ref 150–440)
RDW: 18.7 % — ABNORMAL HIGH (ref 11.5–14.5)
WBC: 11.7 10*3/uL — ABNORMAL HIGH (ref 3.8–10.6)

## 2012-12-12 LAB — HEMOGLOBIN A1C: Hemoglobin A1C: 7 % — ABNORMAL HIGH (ref 4.2–6.3)

## 2012-12-12 LAB — LIPID PANEL
HDL Cholesterol: 84 mg/dL — ABNORMAL HIGH (ref 40–60)
Ldl Cholesterol, Calc: 59 mg/dL (ref 0–100)

## 2012-12-13 ENCOUNTER — Ambulatory Visit: Payer: Self-pay | Admitting: Oncology

## 2012-12-13 ENCOUNTER — Ambulatory Visit: Payer: Self-pay | Admitting: Radiation Oncology

## 2012-12-13 LAB — CBC CANCER CENTER
Basophil #: 0 x10 3/mm (ref 0.0–0.1)
Basophil %: 0.2 %
Eosinophil #: 0 x10 3/mm (ref 0.0–0.7)
Eosinophil %: 0 %
HCT: 38.1 % — ABNORMAL LOW (ref 40.0–52.0)
MCH: 31 pg (ref 26.0–34.0)
MCHC: 33.6 g/dL (ref 32.0–36.0)
MCV: 92 fL (ref 80–100)
Monocyte #: 0.7 x10 3/mm (ref 0.2–1.0)
Neutrophil #: 10.2 x10 3/mm — ABNORMAL HIGH (ref 1.4–6.5)
Platelet: 113 x10 3/mm — ABNORMAL LOW (ref 150–440)
RBC: 4.13 10*6/uL — ABNORMAL LOW (ref 4.40–5.90)
WBC: 11 x10 3/mm — ABNORMAL HIGH (ref 3.8–10.6)

## 2012-12-13 LAB — COMPREHENSIVE METABOLIC PANEL
Alkaline Phosphatase: 65 U/L (ref 50–136)
Bilirubin,Total: 0.8 mg/dL (ref 0.2–1.0)
Calcium, Total: 7.5 mg/dL — ABNORMAL LOW (ref 8.5–10.1)
Chloride: 92 mmol/L — ABNORMAL LOW (ref 98–107)
Co2: 31 mmol/L (ref 21–32)
Creatinine: 1.29 mg/dL (ref 0.60–1.30)
EGFR (Non-African Amer.): 55 — ABNORMAL LOW
Glucose: 188 mg/dL — ABNORMAL HIGH (ref 65–99)
SGOT(AST): 15 U/L (ref 15–37)
Total Protein: 5.7 g/dL — ABNORMAL LOW (ref 6.4–8.2)

## 2012-12-20 ENCOUNTER — Inpatient Hospital Stay: Payer: Self-pay | Admitting: Oncology

## 2012-12-20 LAB — URINALYSIS, COMPLETE
Ketone: NEGATIVE
Nitrite: NEGATIVE
Ph: 6 (ref 4.5–8.0)
Specific Gravity: 1.023 (ref 1.003–1.030)
Squamous Epithelial: NONE SEEN
WBC UR: 2 /HPF (ref 0–5)

## 2012-12-20 LAB — COMPREHENSIVE METABOLIC PANEL
BUN: 37 mg/dL — ABNORMAL HIGH (ref 7–18)
Bilirubin,Total: 1.7 mg/dL — ABNORMAL HIGH (ref 0.2–1.0)
Calcium, Total: 8 mg/dL — ABNORMAL LOW (ref 8.5–10.1)
Creatinine: 1.77 mg/dL — ABNORMAL HIGH (ref 0.60–1.30)
EGFR (Non-African Amer.): 37 — ABNORMAL LOW
Glucose: 383 mg/dL — ABNORMAL HIGH (ref 65–99)
Osmolality: 283 (ref 275–301)
Potassium: 4.7 mmol/L (ref 3.5–5.1)
Sodium: 129 mmol/L — ABNORMAL LOW (ref 136–145)

## 2012-12-20 LAB — CBC CANCER CENTER
Basophil #: 0 x10 3/mm (ref 0.0–0.1)
Basophil %: 0 %
Eosinophil #: 0 x10 3/mm (ref 0.0–0.7)
HCT: 40.5 % (ref 40.0–52.0)
HGB: 13 g/dL (ref 13.0–18.0)
Lymphocyte #: 0.1 x10 3/mm — ABNORMAL LOW (ref 1.0–3.6)
Lymphocyte %: 31 %
MCH: 30.6 pg (ref 26.0–34.0)
MCHC: 32.2 g/dL (ref 32.0–36.0)
MCV: 95 fL (ref 80–100)
Monocyte #: 0 x10 3/mm — ABNORMAL LOW (ref 0.2–1.0)
Monocyte %: 7.2 %
Neutrophil #: 0.2 x10 3/mm — ABNORMAL LOW (ref 1.4–6.5)
RBC: 4.26 10*6/uL — ABNORMAL LOW (ref 4.40–5.90)
RDW: 19.7 % — ABNORMAL HIGH (ref 11.5–14.5)
WBC: 0.3 x10 3/mm — CL (ref 3.8–10.6)

## 2012-12-21 LAB — BASIC METABOLIC PANEL
Anion Gap: 11 (ref 7–16)
BUN: 44 mg/dL — ABNORMAL HIGH (ref 7–18)
Calcium, Total: 7.3 mg/dL — ABNORMAL LOW (ref 8.5–10.1)
Chloride: 98 mmol/L (ref 98–107)
Creatinine: 1.45 mg/dL — ABNORMAL HIGH (ref 0.60–1.30)
Osmolality: 285 (ref 275–301)
Sodium: 133 mmol/L — ABNORMAL LOW (ref 136–145)

## 2012-12-21 LAB — CBC WITH DIFFERENTIAL/PLATELET
Basophil #: 0 10*3/uL (ref 0.0–0.1)
Basophil %: 0.3 %
HGB: 11.4 g/dL — ABNORMAL LOW (ref 13.0–18.0)
Lymphocyte %: 11.4 %
MCV: 93 fL (ref 80–100)
Monocyte %: 4 %
Neutrophil #: 0.3 10*3/uL — ABNORMAL LOW (ref 1.4–6.5)
Platelet: 16 10*3/uL — CL (ref 150–440)
RDW: 19.5 % — ABNORMAL HIGH (ref 11.5–14.5)
WBC: 0.3 10*3/uL — CL (ref 3.8–10.6)

## 2012-12-21 LAB — URINE CULTURE

## 2012-12-22 LAB — CBC WITH DIFFERENTIAL/PLATELET
HCT: 33.2 % — ABNORMAL LOW (ref 40.0–52.0)
MCH: 31 pg (ref 26.0–34.0)
MCHC: 33.5 g/dL (ref 32.0–36.0)
MCV: 92 fL (ref 80–100)
RBC: 3.59 10*6/uL — ABNORMAL LOW (ref 4.40–5.90)
WBC: 0.8 10*3/uL — CL (ref 3.8–10.6)

## 2012-12-22 LAB — MAGNESIUM: Magnesium: 3.4 mg/dL — ABNORMAL HIGH

## 2012-12-23 LAB — CBC WITH DIFFERENTIAL/PLATELET
Basophil #: 0 10*3/uL (ref 0.0–0.1)
Basophil %: 0.2 %
Eosinophil %: 0.2 %
Lymphocyte %: 2.4 %
MCH: 30.8 pg (ref 26.0–34.0)
MCV: 92 fL (ref 80–100)
Monocyte %: 2.2 %
Neutrophil #: 1 10*3/uL — ABNORMAL LOW (ref 1.4–6.5)
Platelet: 19 10*3/uL — CL (ref 150–440)
RBC: 3.34 10*6/uL — ABNORMAL LOW (ref 4.40–5.90)

## 2012-12-23 LAB — BASIC METABOLIC PANEL
BUN: 30 mg/dL — ABNORMAL HIGH (ref 7–18)
Chloride: 102 mmol/L (ref 98–107)
Co2: 29 mmol/L (ref 21–32)
EGFR (African American): >60
Osmolality: 288 (ref 275–301)
Potassium: 3.4 mmol/L — ABNORMAL LOW (ref 3.5–5.1)

## 2013-01-12 ENCOUNTER — Ambulatory Visit: Payer: Self-pay | Admitting: Oncology

## 2013-01-12 DEATH — deceased

## 2014-01-17 IMAGING — CT CT NECK WITHOUT AND WITH CONTRAST
2 of 3 series · 8 of 14 positions shown, 9 images · non-contrast
Comparison: none

REASON FOR EXAM: Carcinoma Tongue Base Pt needs copy of Disk
COMMENTS:

PROCEDURE:     CT  - CT NECK W/WO  - February 19, 2012  [DATE]
RESULT:
TECHNIQUE: Neck CT is performed without and with iodinated intravenous
contrast. The patient received 75 ml of Dsovue-1NK iodinated intravenous
contrast. Images are reconstructed at 3.0 mm slice thickness in the axial
plane.
No previous exam is available for comparison.

[Series 2: soft tissue wo · axial · 0.52mm/px · z∈[-350,-202]mm · 4 of 83 slices shown, 5 images]
[im 17/83  soft-tissue]
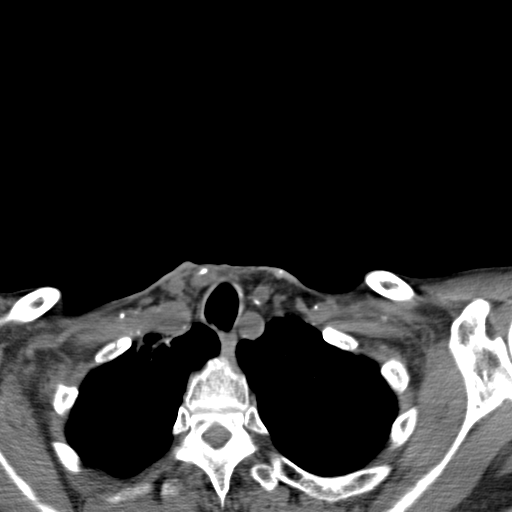
[im 17/83  bone]
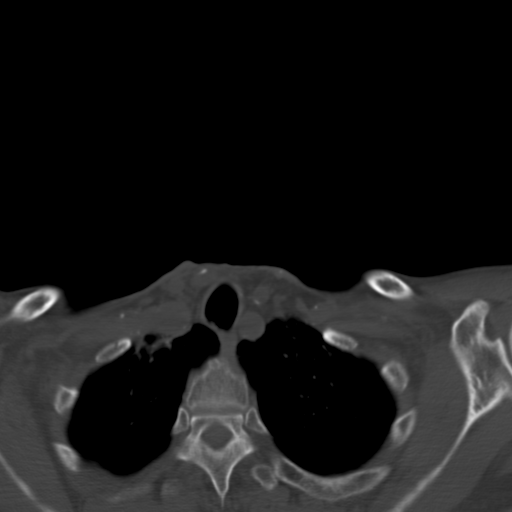
[im 33/83  bone]
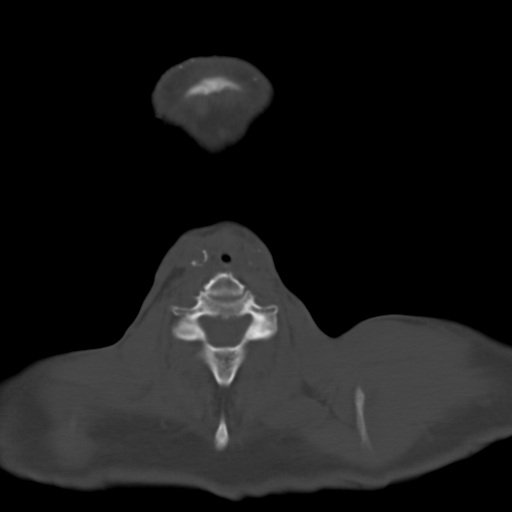
[im 50/83  bone]
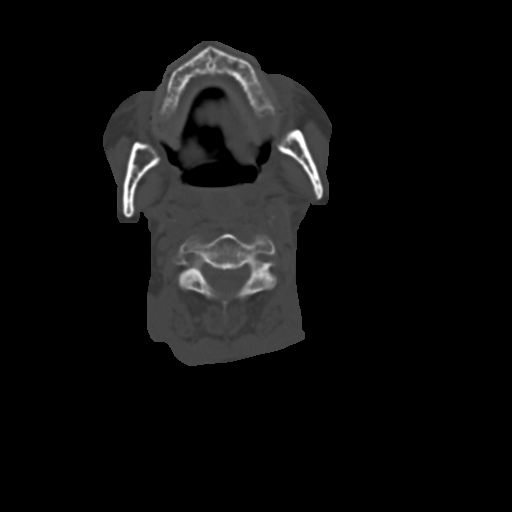
[im 66/83  bone]
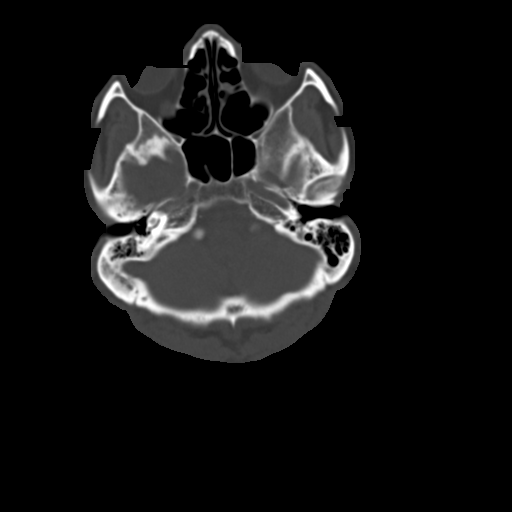

[Series 4: soft tissue with · axial · 0.52mm/px · z∈[-350,-202]mm · 4 of 83 slices shown]
[im 17/83  soft-tissue]
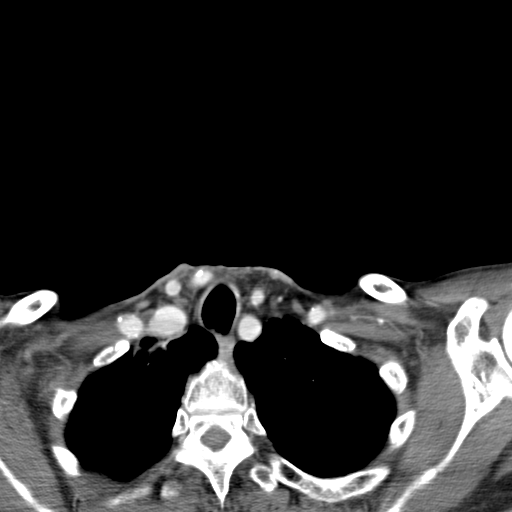
[im 33/83  soft-tissue]
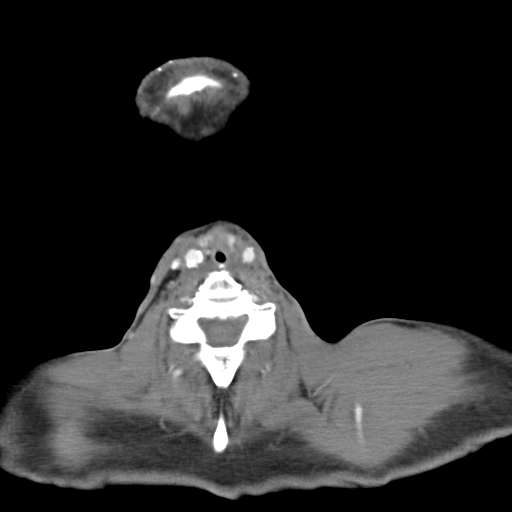
[im 50/83  soft-tissue]
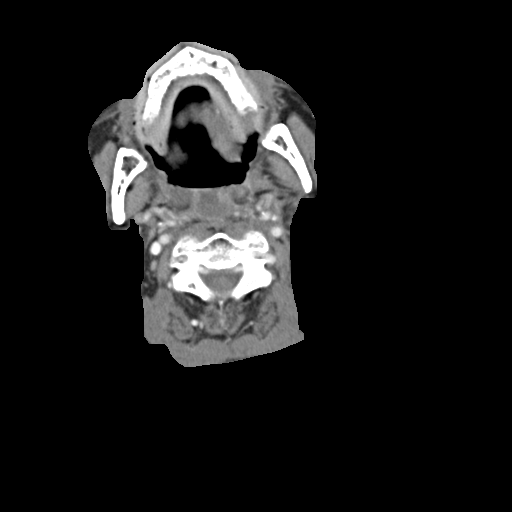
[im 66/83  soft-tissue]
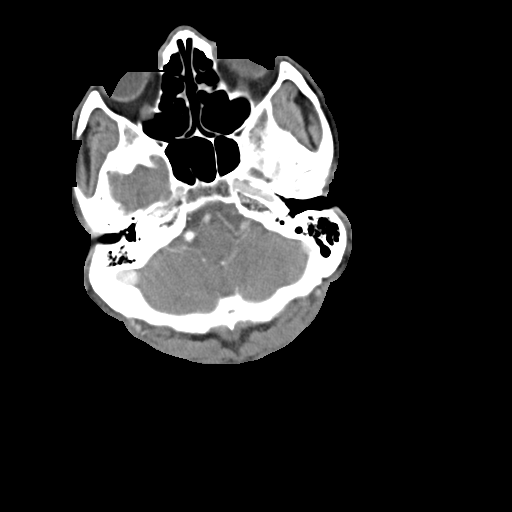

[8 of 14 positions shown; findings below may reference images not displayed]

FINDINGS: Noncontrast images through the base of the brain show no definite
abnormal calcification. There is some minimal mucosal thickening in the
floor of the maxillary sinuses. The other portions of the sinuses show
grossly normal aeration. There is some sclerosis in the right mastoid tip
region. There is significant beam hardening artifact from the patient's
dental work obscuring portions of the oral cavity and tongue. Tracheostomy
is present. Superior to the larynx, the airway is obscured. Prevertebral
soft tissue prominence is seen in the retropharyngeal region. There does not
appear to be a discrete enhancing mass. There is some spherical enhancement
on image 43 minimally to the right of midline which is less well seen on
image 41 and 42. This area measures approximately 10.6 mm in diameter. The
base of the brain shows no abnormal enhancement. Atherosclerotic
calcification is noted in the carotid arteries and in the thoracic aortic
arch. The included upper portion of the lungs shows some apical fibrotic
change. A discrete mass is not evident.
IMPRESSION: 1. Tracheostomy present.
2. Loss of visualization of the airway superior to this with some
heterogeneous enhancement in the base of the tongue region which is
difficult to demonstrate as a confluent lesion. Direct visualization is
recommended. The prevertebral soft tissues in the posterior pharyngeal
region are also prominent. Malignancy in that region is not excluded.
Consider correlation with PET/CT.

[REDACTED]

## 2014-09-26 IMAGING — CR DG CHEST 2V
1 series · 2 of 2 positions shown · non-contrast
Comparison: none

REASON FOR EXAM: Chest Pain
COMMENTS:

PROCEDURE:     DXR - DXR CHEST PA (OR AP) AND LATERAL  - October 28, 2012  [DATE]
RESULT:     Two-view chest dated 10/28/2012 comparison made to prior study
dated 09/24/2012

[Series 1: pa · 0.17mm/px · 2 of 2 slices shown]
[im 1/2]
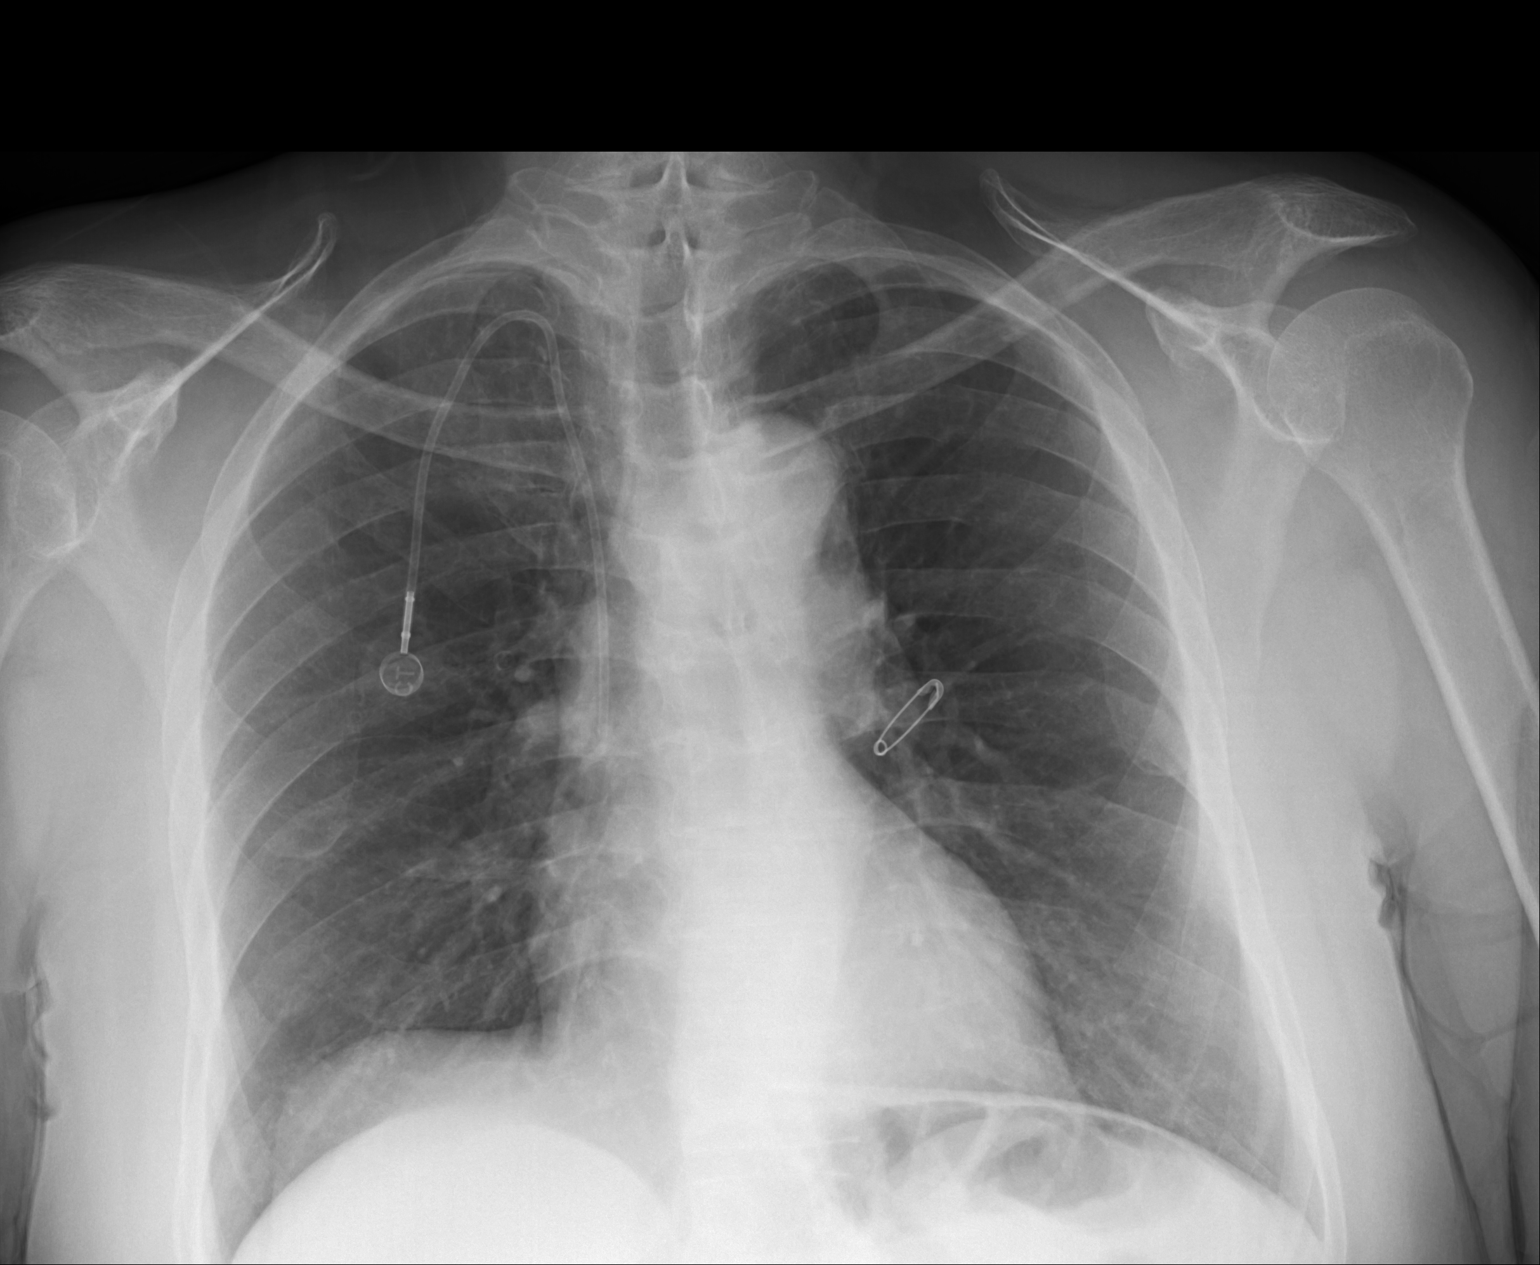
[im 2/2]
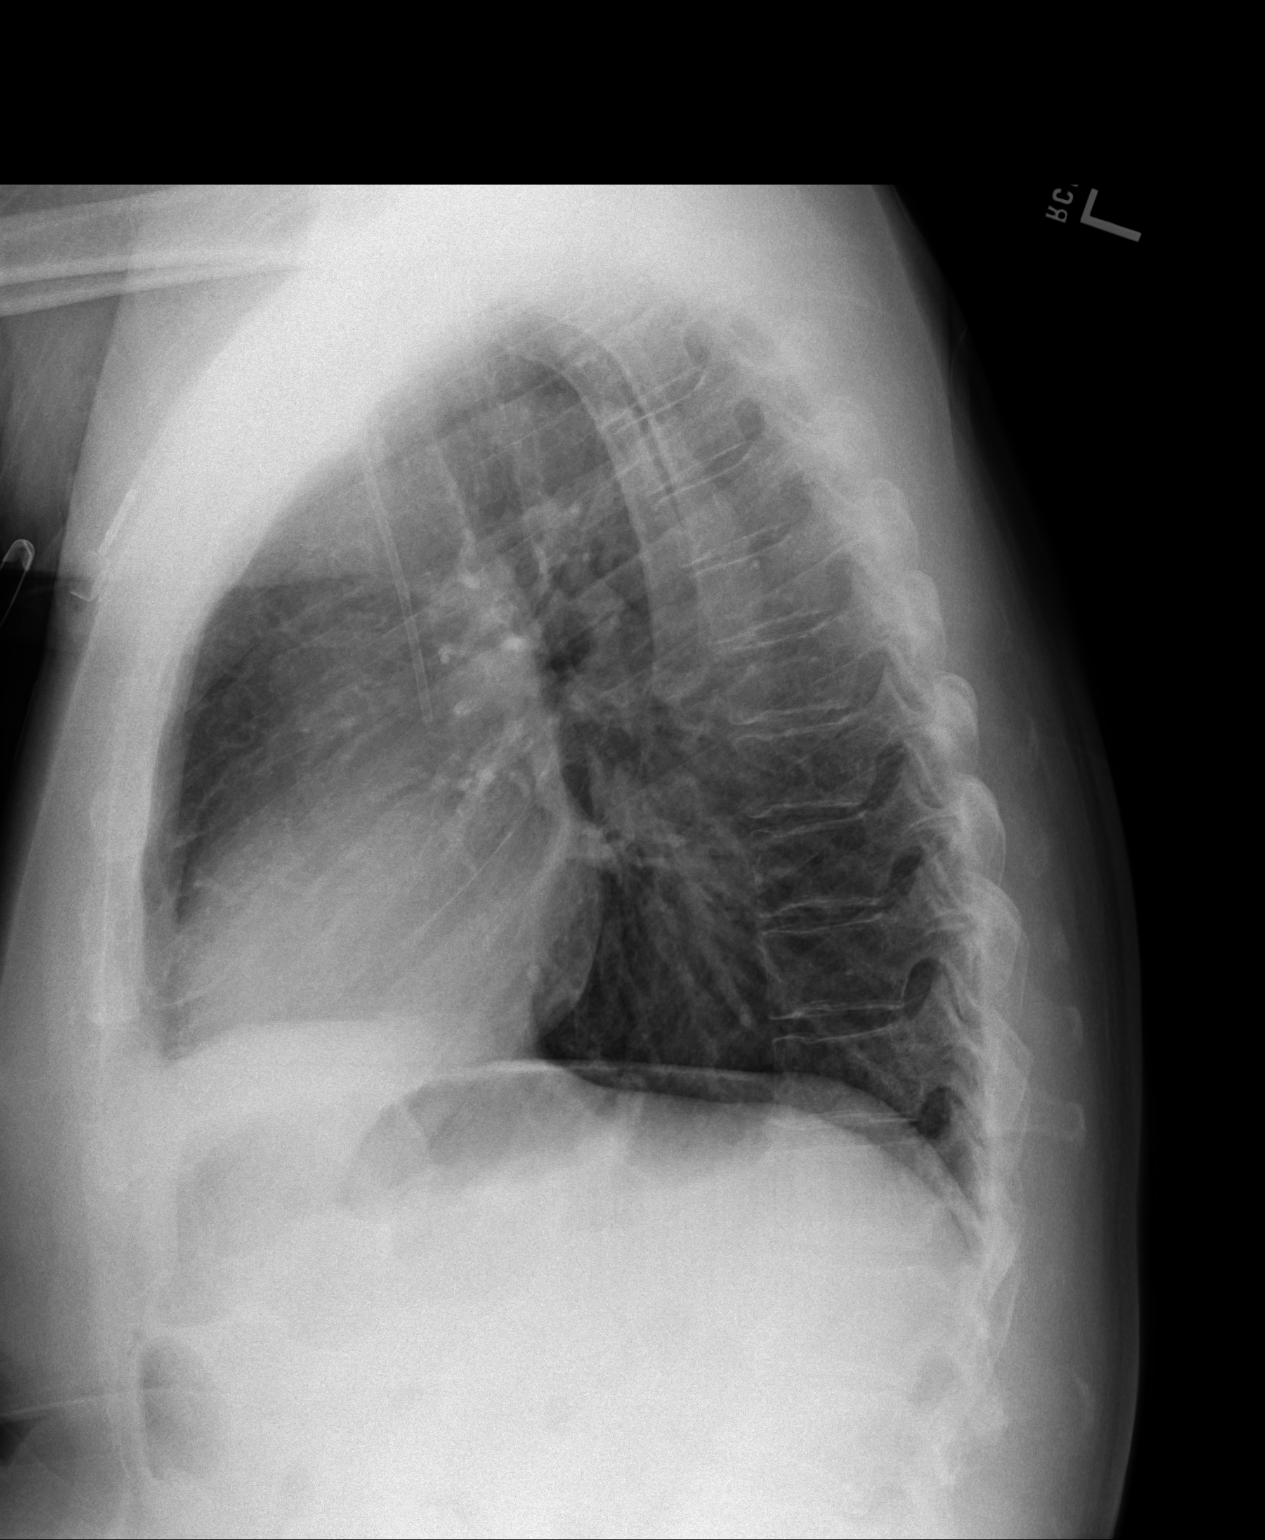

[2 of 2 positions shown; findings below may reference images not displayed]

FINDINGS: The lungs are clear. The cardiac silhouette and visualized bony
skeleton are unremarkable. A right-sided central venous catheter is
appreciated. The tip projecting regions appear vena cava.
IMPRESSION: 1. Chest radiograph without evidence of acute cardiopulmonary disease.

## 2015-01-01 NOTE — Consult Note (Signed)
PATIENT NAME:  [name], WAYMAN H MR#:  [id number] DATE OF BIRTH:  [birth year]  DATE OF CONSULTATION:  08/04/2012  REFERRING PHYSICIAN:   CONSULTING PHYSICIAN:  Cammy CopaPaul H. Chance Munter, MD  REASON FOR CONSULTATION: Increased secretions and blood from the trach site.   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 In June of this year he is found to have recurrence somewhere in his mouth or throat. He cannot tell me exactly where it was. He has had chemotherapy and radiation in these areas and finished that over a month ago. He has recently had more secretions from his trach site and bloody secretions as well. He presented to the hospital and had elevated troponin as well as CK-MB and CK total levels. He was admitted for further evaluation of his heart as well as his airway.   PAST MEDICAL HISTORY:  1. 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 2. Hypertension. 3. Hypothyroidism. 4. Anxiety. 5. Chronic obstructive pulmonary disease. 6. History of sigmoid diverticulosis.  7. Poor oral intake. 8. Status post PEG and permanent trach although he does not use a trach tube.   DRUG ALLERGIES: None.   SOCIAL HISTORY: He used to smoke but he quit. He is not using any alcohol.   FAMILY HISTORY: Sister with diabetes, mild hypertension.   OUTPATIENT MEDICATIONS: As noted in the chart and reviewed.   REVIEW OF SYSTEMS: As noted in the chart and reviewed.   PHYSICAL EXAMINATION: The patient is awake and alert and responding appropriately saying yes and no. He is trying to answer questions by using his hands as well. He has an electrolarynx that he uses to mouth some words and can communicate somewhat with this. His nose is open and clear. Oropharynx shows him to be edentulous. He  has limited tongue mobility. I cannot see the posterior tongue at this time. I do not see any oral lesions right now, but I do not get a very good look inside the posterior pharynx. The neck is very firm, thick tissues from previous chemoradiation therapy. I do not feel any nodes or masses. He has lots of scar tissue. His trach stoma is about 1.5 cm in size. There is dried blood on the outside of it. There is no trach tube in place which he does not normally use. He has a trach collar around his neck with high humidity.   PROCEDURE: Flexible scope is passed through the trach site to visualize the distal trachea. It is very dry with dried blood on the lateral walls of the trachea. More distally it is healthy and there is less dried blood. The carina has one little scab on it. Mainstem bronchi are totally clear on both sides. No sign of blood further down.    RECOMMENDATIONS: He has a very dry trachea with some dried blood here, some of this may become from suctioning trauma. There is no active bleeding. No sign of any lesions inside his trachea. He needs high humidity and needs to have trach collar and even room humidifier to help moisturize the air. I do not see any signs of obvious residual cancer inside his mouth. I cannot see his base of tongue and posterior pharynx area well. This can be re-evaluated once he gets discharged to make sure that the recurrent cancer is  responding well to chemotherapy and radiation. He needs hydration as well and can get some IV fluids while he is here as well as increased PEG intake. Any suctioning done on his trachea should be done very cautiously as it can cause further bleeding.   ____________________________ Cammy Copa, MD phj:cms D: 08/04/2012 21:57:03 ET T: 08/05/2012 09:36:16 ET JOB#: [id number]  cc: Cammy Copa, MD, <Dictator> Cammy Copa MD ELECTRONICALLY SIGNED 08/17/2012 11:45

## 2015-01-01 NOTE — Consult Note (Signed)
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  Patient was admitted hospital with purulent and bloody discharge from tracheostomy with swelling.    HPI   Patient is here for further evaluation and treatment consideration regarding recurrent head and neck cancer, base of the tongue, locally advanced disease, T3, N0, M0.and was admitted in the hospital with possibly infection.  Culture is showing staph aureus infection.being treated with IV antibiotics.  Swelling is improved.no chills.  No fever.  Patient is being evaluated by ENT surgeon.  PFSH:   Comments No family history of colorectal cancer, breast cancer, or ovarian cancer.    Social History negative alcohol, negative tobacco    Comments Patient used to smoke and drink    Additional Past Medical and Surgical History As mentioned above   Review of Systems:   General weakness    Performance Status (ECOG) 1    HEENT Purulent and bloody discharge from tracheostomy with swelling    Lungs cough  SOB    Cardiac no complaints    GI no complaints    GU no complaints    Musculoskeletal no complaints    Extremities no complaints    Skin no complaints    Neuro no complaints    Endocrine no complaints    Psych no complaints   NURSING NOTES: Interdisciplinary Discharge Planning:   20-Nov-13 23:12    Advance Directive (Medical Healthcare): yes    Advanced Directive in Record Manager?: No    Copy placed on chart?: No    Family to bring copy?: No  NURSING NOTES: **Vital Signs.:   22-Nov-13 05:16    Vital Signs Type: Routine    Temperature Temperature (F): 98.3    Celsius: 36.8    Temperature Source: Oral    Pulse Pulse: 81    Respirations Respirations: 22    Systolic BP Systolic BP: 131    Diastolic BP (mmHg) Diastolic BP (mmHg): 87    Mean BP: 101    Pulse Ox % Pulse Ox %: 98    Pulse Ox Activity  Level: At rest    Oxygen Delivery: Doyce Pararach Aerosol Mask   Physical Exam:   General Patient is alert oriented lying in the bed not in any acute distress    HEENT: Tracheostomy site some stranding around it purulent discharge    Lungs: rhonchi    Cardiac: regular rate, rhythm    Abdomen: soft  nontender  positive bowel sounds    Skin: intact    Extremities: No edema, rash or cyanosis    Neuro: AAOx3     mouth ca:    htn:    throat ca:    Esophageal Stenosis:    Back Pain, Chronic:    Throat Radiation:    Anemia:    Hypothyroidism:    throat cancer:    HTN:    Laryngectomy:    peg tube:    laryngectomy 21 years ago:    No Known Allergies:     aspirin 81 mg oral delayed release tablet: 1 tab(s) orally once a day, Active, 0, None   levothyroxine 125 mcg (0.125 mg) oral tablet: 1 tab(s) orally once a day, Active, 30, None   lisinopril 40 mg oral tablet: 1 tab(s) orally once a day, Active, 30, None   ALPRAZolam 0.25 mg oral tablet: 1 tab(s) orally once a day, Active, 12, None   amlodipine 10 mg oral tablet:  1 tab(s) orally once a day, Active, 0, None  Laboratory Results: Routine Chem:  22-Nov-13 03:39    Magnesium, Serum  1.7 (1.8-2.4 THERAPEUTIC RANGE: 4-7 mg/dL TOXIC: > 10 mg/dL  -----------------------)   Phosphorus, Serum 2.7 (Result(s) reported on 05 Aug 2012 at 04:06AM.)  Cardiac:  01-Nov-13 18:16    Troponin I  0.08 (0.00-0.05 0.05 ng/mL or less: NEGATIVE  Repeat testing in 3-6 hrs  if clinically indicated. >0.05 ng/mL: POTENTIAL  MYOCARDIAL INJURY. Repeat  testing in 3-6 hrs if  clinically indicated. NOTE: An increase or decrease  of 30% or more on serial  testing suggests a  clinically important change)  20-Nov-13 19:05    CK, Total  1523   CPK-MB, Serum  10.1 (Result(s) reported on 03 Aug 2012 at 07:55PM.)   Troponin I  0.10 (0.00-0.05 0.05 ng/mL or less: NEGATIVE  Repeat testing in 3-6 hrs  if clinically indicated. >0.05  ng/mL: POTENTIAL  MYOCARDIAL INJURY. Repeat  testing in 3-6 hrs if  clinically indicated. NOTE: An increase or decrease  of 30% or more on serial  testing suggests a  clinically important change)  21-Nov-13 04:34    CK, Total  1708   CK, Total  1691 (Result(s) reported on 04 Aug 2012 at 05:29AM.)   CPK-MB, Serum  9.8 (Result(s) reported on 04 Aug 2012 at 05:41AM.)   Troponin I  0.11 (0.00-0.05 0.05 ng/mL or less: NEGATIVE  Repeat testing in 3-6 hrs  if clinically indicated. >0.05 ng/mL: POTENTIAL  MYOCARDIAL INJURY. Repeat  testing in 3-6 hrs if  clinically indicated. NOTE: An increase or decrease  of 30% or more on serial  testing suggests a  clinically important change)    10:58    CK, Total  1610   CPK-MB, Serum  8.2   Troponin I  0.10 (0.00-0.05 0.05 ng/mL or less: NEGATIVE  Repeat testing in 3-6 hrs  if clinically indicated. >0.05 ng/mL: POTENTIAL  MYOCARDIAL INJURY. Repeat  testing in 3-6 hrs if  clinically indicated. NOTE: An increase or decrease  of 30% or more on serial  testing suggests a  clinically important change)   Assessment and Plan:  Impression:   1. 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  Agree with IV antibiotics.  Patient will need another PET scan 6-8 weeks down the road for reevaluation of disease  Plan:   continue intravenous antibiotic.we will follow this patient along with you.    Electronic Signatures: Laddie Aquas (MD)  (Signed [phone] 07:47)  Authored: HISTORY OF PRESENT ILLNESS, PFSH, ROS, NURSING NOTES, PE, PAST MEDICAL HISTORY, ALLERGIES, HOME MEDICATIONS, LABS, ASSESSMENT AND PLAN   Last Updated: 22-Nov-13 07:47 by Laddie Aquas (MD)

## 2015-01-01 NOTE — Op Note (Signed)
PATIENT NAME:  Mark [name], WAYMAN H MR#:  [id number] DATE OF BIRTH:  [birth year]  DATE OF PROCEDURE:  08/04/2012  PREOPERATIVE DIAGNOSIS: Hemoptysis.   POSTOPERATIVE DIAGNOSIS: Very dry tracheal mucosa causing bleeding.   OPERATIVE PROCEDURE: Tracheobronchoscopy.   SURGEON: Cammy CopaPaul H. Nyesha Cliff, MD   ANESTHESIA: None.  COMPLICATIONS: None.   TOTAL ESTIMATED BLOOD LOSS: None.   DESCRIPTION OF PROCEDURE: The patient was seen at the bedside in his room, and he has a permanent trachea stoma from laryngectomy done 11 years ago. He has had some bleeding from his trach site recently. He has got dried blood on the outside of his trachea. The flexible scope is passed through the trach stoma and directly into the trachea. He had dried blood around the trachea that was relatively small. There were very dry mucous membranes throughout the trachea down to the mainstem bronchus. I do not see any signs of lesions anywhere but just some dried blood on the surface. The carina has a small little scab right at the midpoint where it looks like there is small little trauma there. The mainstem bronchi are clear. Going down on both sides, there is no significant bleeding down further, and it does not look like any bleeding coming from the lungs but this is from dry trachea. He has a trach collar in place. He needs that for high humidity.   The patient tolerated the procedure well. This was done at the bedside. There were no operative complications.  ____________________________ Cammy CopaPaul H. Brynlyn Dade, MD phj:cbb D: 08/04/2012 21:50:46 ET T: 08/05/2012 09:30:59 ET JOB#: [id number] Cammy CopaPAUL H Amsi Grimley MD ELECTRONICALLY SIGNED 08/17/2012 11:44

## 2015-01-01 NOTE — H&P (Signed)
PATIENT NAME:  Mark [name], Mark [name] MR#:  [id number] DATE OF BIRTH:  1941/04/15  DATE OF ADMISSION:  08/03/2012  REFERRING PHYSICIAN: Dr. Brien Mates  PRIMARY CARE PHYSICIAN: Dr. Candelaria Stagers but will see Dr. Sampson Goon at [facility]   CHIEF COMPLAINT: Increased secretions from his trach site.   HISTORY OF PRESENT ILLNESS: The patient is a 74 year old African American male with a history of laryngeal carcinoma status post laryngectomy over a decade ago who was just found to have a recurrence in 02/2012 status post  chemotherapy radiation, hypothyroidism, chronic obstructive pulmonary disease, tracheostomy, and PEG who is here in the hospital. The patient is unable to provide much information as he does not have his speaking valve currently and I called the patient's wife as well as discussed the case with the ER physician. Apparently per wife, he came in for increased secretions from his trach site which could be bloody as well as blood-tinged. There are no fevers or chills. There is no chest pain. There is some shortness of breath. Here he was found to have mildly elevated troponin as well as CK-MB and CK total levels. There are no acute ST elevations or depressions on the EKG. He was given aspirin. X-ray of the chest is negative for acute pneumonia and the hospitalist service was contacted for further evaluation and management.   PAST MEDICAL HISTORY:  1. History of cancer of the larynx status post laryngectomy more than 11 years ago with  recurrence  2. Hypertension. 3. Hypothyroidism. 4. Anxiety.  5. Chronic obstructive pulmonary disease.  6. History of sigmoid diverticulosis.  7. History of hospitalizations for poor p.o. intake and electrolyte imbalances in the past.  8. Status post PEG and trach.   ALLERGIES: None.   SOCIAL HISTORY: He used to smoke but quit. No alcohol or drug use.   FAMILY HISTORY: Sister with diabetes. Mom with hypertension.   OUTPATIENT MEDICATIONS:  1. Alprazolam 0.25 mg  once a day.  2. Amlodipine 10 mg daily.  3. Aspirin 81 mg daily.  4. Levothyroxine 125 mcg daily.  5. Lisinopril 40 mg daily.  REVIEW OF SYSTEMS: Unable to fully obtain given the patient is unable to speak but from shaking of his head and discussion with the wife: CONSTITUTIONAL: No fever or weakness. Positive for some left-sided abdominal pain. EYES: No blurry vision. ENT: Status post laryngeal cancer and laryngectomy.  RESPIRATORY: Some shortness of breath and some secretions from the trach site including blood-tinged and bloody sputum. CARDIOVASCULAR: Denies any pains in the chest or swelling in the legs. GI: Some left upper quadrant abdominal pain. No nausea, vomiting, or diarrhea. GU: Denies dysuria. SKIN: No new rashes. MUSCULOSKELETAL: Has chronic back pain. NEUROLOGIC: Unable to do get information about this. PSYCHIATRIC: Positive for anxiety.   PHYSICAL EXAMINATION:  VITAL SIGNS: Temperature on arrival 99.6, pulse 107, respiratory rate 28, last blood pressure is 132/77, and oxygen saturation 100% on oxygen. Initially 95% on room air.   GENERAL: The patient is a well developed, well-built African American male sitting in bed.   HEENT: Normocephalic, atraumatic. Pupils are equal and reactive. Moist mucous membranes. Unable to open mouth fully, though.   NECK: Supple. No thyroid tenderness. There is a trach site without any significant drainage.   CARDIOVASCULAR: S1, S2, tachycardic. No murmurs, rubs, or gallops.   PULMONARY: Right upper lobe wheezing. Otherwise good air entry.   ABDOMEN: Soft. Some left upper quadrant tenderness to deep palpation. PEG is intact. Positive bowel sounds in all quadrants.  EXTREMITIES: No significant lower extremity edema.   NEURO:. Strength is 5/5 in all quadrants. Face is symmetrical. Tongue protrudes midline.   PSYCHIATRIC: Awake, alert. Unable to entertain orientation at this point, but shakes his head yes or no appropriately.   SKIN: No obvious  rashes.   LABORATORY DATA: Glucose 109, BUN 27, creatinine 1.13, sodium 132, anion gap 6, troponin 0.1, CK-MB 10.1, CK total 1523. WBC 8, hemoglobin 12.3, hematocrit 36.8, platelets 165. X-ray of the chest: No acute pulmonary disease. No infiltrates. EKG: Sinus tachycardia with some PVCs. No acute ST elevations or depressions.   ASSESSMENT AND PLAN: We have a 74 year old African American male with history of laryngeal cancer status post laryngectomy over a decade ago with recent recurrence of cancer who [facility] Dr. Doylene Canninghoksi, status post PEG and trach, and hypertension, presents with increased secretions from the trach side and was also found to have positive troponin. He has no chest pain. X-ray of the chest is negative for acute pulmonary process. He has no fever and no leukocytosis. We will admit the patient for observation. We will start the patient on aspirin, statin, and beta blocker. We will obtain an echocardiogram and obtain a cardiology consult. We will continue his thyroid medicine. In regards to the increased secretions from the trach, he has received a dose of Levaquin. We will continue the Levaquin for now, obtain an ENT consult, and start the patient on some nebulizers p.r.n. I will also get an x-ray of the abdomen flat and erect. Given the bleeding at the trach site at the stoma I will not start him on heparin for deep vein thrombosis prophylaxis but SCDs and TEDs for now.   TOTAL TIME SPENT: 40 minutes.   ____________________________ Krystal EatonShayiq Enrica Corliss, MD sa:bjt D: 08/03/2012 21:46:52 ET T: 08/04/2012 07:04:42 ET JOB#: [id number]  cc: Krystal EatonShayiq Haasini Patnaude, MD, <Dictator> Stann Mainlandavid P. Sampson GoonFitzgerald, MD Marcelle SmilingSHAYIQ [facility]astroenterology[facility]ADZIA MD ELECTRONICALLY SIGNED 08/25/2012 11:09

## 2015-01-01 NOTE — Discharge Summary (Signed)
PATIENT NAME:  Mark [name], Mark [name] MR#:  [id number] DATE OF BIRTH:  16-Apr-1941  DATE OF ADMISSION:  08/03/2012 DATE OF DISCHARGE:  08/05/2012  DISCHARGE DIAGNOSES:  1. Trach collar infection.  2. Laryngeal cancer, recurrent.  3. Positive troponins and elevated CPK.  4. Hypothyroidism.  5. Hypertension   HOSPITAL COURSE: Please see admission history and physical for details. Basically this is a 74 year old with a tracheostomy, recurrent laryngeal cancer followed by Dr. Doylene Canninghoksi in Oncology. He came in with increased drainage from his tracheostomy and shortness of breath. He was felt to have tracheitis. He was started on antibiotics with levofloxacin. Culture from the trach site ended up growing MSSA. He improved remarkably over two days.   He also was found to have elevated troponins and CPKs. He was seen by Dr. Lady GaryFath in Cardiology who recommended echocardiogram. He had this done. At discharge will be seen in outpatient follow-up for positive troponins.   He was also seen by Dr. Doylene Canninghoksi who also recommended outpatient continued follow-up of his laryngeal cancer.   DISCHARGE MEDICATIONS:  1. Levofloxacin 500 mg once a day for seven days.  2. Doxycycline 100 mg twice a day for 21 days.  3. Aspirin 81 mg once a day.  4. Levothyroxine 125 mcg once a day.  5. Lisinopril 40 mg once a day.  6. Alprazolam 0.25 once a day.  7. Amlodipine 1 tablet once a day 10 mg.  HOME HEALTH: None.   DIET: Tube feeds. He will resume these.   ACTIVITY LIMITATIONS: As tolerated.  FOLLOW-UP:  1. The patient will see Dr. Sampson GoonFitzgerald and Dr. Doylene Canninghoksi in 1 to 2 weeks.  2. We also set him up to see Dr. Lady GaryFath.   DISCHARGE INSTRUCTIONS: The patient was instructed to return if he develops any chest pain, shortness of breath, fevers, or recurrent symptoms.   TIME SPENT: This discharge took 35 minutes.   ____________________________ Stann Mainlandavid P. Sampson GoonFitzgerald, MD dpf:drc D: 08/09/2012 21:26:48 ET T: 08/10/2012 11:30:38  ET JOB#: [id number]  cc: Stann Mainlandavid P. Sampson GoonFitzgerald, MD, <Dictator> Mark [name] Sampson GoonFITZGERALD MD ELECTRONICALLY SIGNED 08/16/2012 21:43

## 2015-01-01 NOTE — Consult Note (Signed)
Reason for Visit: This 74 year old Male patient presents to the clinic for initial evaluation of  Recurrent head and neck cancer .   Referred by [facility].  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 Patient developed increasing dysphasia needed a PEG tube placed. Recently been complaining of increasing dysphasia and was found to have a lesion of the base of tongue. Workup at [facility] not to be a surgical candidate. Recommendation for chemoradiation was made. On PET/CT scan lesion looks to be a T2 lesion with no evidence of neck adenopathy. He has had some oral cavity bleeding back in May which has progressively worsened. Direct laryngoscopy and biopsy were positive for squamous cell carcinoma. He is seen today for consideration of treatment. Doing fairly well. Still has some oral pain and occasional bleeding. His feeding is all done through his PEG tube.  Past Hx:    Esophageal Stenosis:    Back Pain, Chronic:    Throat Radiation:    Anemia:    Hypothyroidism:    throat cancer:    HTN:    peg tube:    laryngectomy 21 years ago:   Past, Family and Social History:   Past Medical History positive    Cardiovascular hypertension    Respiratory COPD    Gastrointestinal diverticulitis; Esophageal stenosis secondary to  chemoradiation and surgery for laryngeal cancer    Endocrine hypothyroidism    Neurological/Psychiatric anxiety    Past Medical History Comments Has been hospitalized for dehydration and electrolyte imbalance.    Family History positive    Family History Comments Hypertension and mother diabetes and sister wife currently under treatment for cancer    Social History positive    Social History Comments Greater than 50-pack-year smoking history has quit smoking. Occasional EtOH use.    Additional Past Medical and Surgical History Accompanied by his wife today.   Allergies:   No Known Allergies:   Home Meds:  Home Medications: Medication Instructions Status  aspirin 81 mg oral delayed release tablet 1 tab(s) orally once a day Active  hydrochlorothiazide 25 mg oral tablet 1 tab(s) orally once a day Active  Klor-Con M20 oral tablet, extended release 1 tab(s) orally once a day **tablets need to be crushed** Active  levothyroxine 125 mcg (0.125 mg) oral tablet 1 tab(s) orally once a day Active  lisinopril 20 mg oral tablet 1 tab(s) orally once a day Active  alprazolam 0.25 mg oral tablet 1 tab(s) orally every 8 hours, As Needed- for Anxiety, Nervousness if needs more help with nerves  Active  amlodipine 5 mg oral tablet 1 tab(s) orally once a day for blood pressure Active  MiraLax 17 gram(s) orally once a day Active  docusate sodium 100 mg oral capsule 1 cap(s) orally 2 times a day Active  magic mouthwash   3 times a day Active  Lortab 7.5/500  orally 2-3 teaspoons every 6 hrs as needed Active  nystatin 100,000 units/mL oral suspension 5 milliliter(s) orally 4 times a day Active  ranitidine 150 mg oral capsule 1 cap(s) orally 2 times a day Active  Writer oral tablet 1 tab(s) orally once a day Active  acetaminophen 500 mg oral tablet 2 tab(s) orally every 6 hours Active   Review of Systems:   General negative    Performance Status (ECOG) 0    Skin negative    Breast  negative    Ophthalmologic negative    ENMT see HPI    Respiratory and Thorax see HPI    Cardiovascular negative    Gastrointestinal see HPI    Genitourinary negative    Musculoskeletal negative    Neurological negative    Psychiatric see HPI    Hematology/Lymphatics negative    Endocrine see HPI    Allergic/Immunologic negative   Nursing Notes:  Nursing Vital Signs and Chemo Nursing Nursing Notes: *CC Vital Signs Flowsheet:   18-Jul-13 08:45   Temp Temperature 97.4   Pulse Pulse 80   SBP SBP 168   DBP DBP 109   Pain Scale (0-10)  0   Current Weight (kg) (kg) 78.4   Physical Exam:  General/Skin/HEENT:   General normal    Skin normal    Eyes normal    Additional PE Well-developed male in NAD uses a speech amplification device for communication. Oral cavity shows teeth in good state of repair. He has moderate xerostomia. He has some tenderness in the anterior floor the mouth I can detect on palpation no evidence of mass or nodularity in that area. Patient does have significant trismus. Indirect mirror examination is not show base of tongue mass. There is asymmetry to the base of tongue. There seems to be more prominence of the left tongue base. Neck is significantly fibrotic. No evidence of adenopathy is detected in the cervical chain or supraclavicular fossa. He has changes consistent with prior radiation and surgery. Tracheostomy stoma is patent without evidence of nodularity or mass. Lungs are clear to A&P cardiac examination shows regular rate and rhythm.   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  Plan:   at  this time I have asked Dr. Doylene Canning to evaluate the patient for possibility of concurrent chemotherapy. I will plan on a salvage dose of6400 cGy using IMRT treatment planning and delivery to direct to the PET avid area and spare contralateral adjoining tissue that is prior received radiation. Risks and benefits of treatment including skin reaction, fatigue, increasing dysphasia and possible significant tissue breakdown since we are treatinga prior radiated area were all explained in detail to the patient and his wife. We will obtain his consultation notes from radiation oncology at [facility] for our files. I have set him up for CT simulation next week and he will see Dr. Doylene Canning at that time for consultation regarding concurrent chemotherapy.  I would like to take this opportunity to thank you for allowing me to continue to participate in this patient's care.  CC Referral:   cc: Dr. Mila Merry, Dr. Andee Poles  Electronic Signatures: Avyay Coger, Gordy CouncilmanGlenn S (MD)  (Signed 18-Jul-13 10:51)  Authored: HPI, Diagnosis, Past Hx, PFSH, Allergies, Home Meds, ROS, Nursing Notes, Physical Exam, Encounter Assessment and Plan, CC Referring Physician   Last Updated: 18-Jul-13 10:51 by Rebeca Alerthrystal, Miri Jose S (MD)

## 2015-01-01 NOTE — Consult Note (Signed)
General Aspect 74 yo [place] American male with history of laryngeal carcinoma s/p laryngectomy now with recurrence of his carcinoma dx in 6/13. He is s/p chemotherapy, Rad Rx. He is now admitted after being noted to have increasesd secretions from his tracheostomy site. He also is being treated for copd, hypothyroidism and failure to thrive with a PEG tube in place. He is a difficult historian do to his trach but denes chest pain or sob at present. He has a mild troponin elevation to 0.10 with no significant CK/MB elevation. EKG reveals sinus rhythm. CXR was unremarkable for signficant pulmonary disease. He remains hemodynamically stable. He denies any prior cardiac history.   Physical Exam:   GEN thin, disheveled    HEENT hearing intact to voice    NECK s/p laryngectomy     RESP no use of accessory muscles  rhonchi     CARD Regular rate and rhythm  Murmur     Murmur Systolic     Systolic Murmur axilla     ABD denies tenderness  normal BS  no Abdominal Bruits     LYMPH negative neck    EXTR negative cyanosis/clubbing, negative edema    SKIN normal to palpation    NEURO cranial nerves intact, motor/sensory function intact    PSYCH A+O to time, place, person   Review of Systems:   Subjective/Chief Complaint sob    General: No Complaints     Skin: No Complaints     ENT: No Complaints     Eyes: No Complaints     Neck: No Complaints     Respiratory: Short of breath  Wheezing     Cardiovascular: No Complaints     Gastrointestinal: No Complaints     Genitourinary: No Complaints     Vascular: No Complaints     Musculoskeletal: No Complaints     Neurologic: No Complaints     Hematologic: No Complaints     Endocrine: No Complaints     Psychiatric: No Complaints     Review of Systems: All other systems were reviewed and found to be negative     Medications/Allergies Reviewed Medications/Allergies reviewed        mouth ca:    htn:    throat ca:     Esophageal Stenosis:    Back Pain, Chronic:    Throat Radiation:    Anemia:    Hypothyroidism:    throat cancer:    HTN:    Laryngectomy:    peg tube:    laryngectomy 21 years ago:   Home Medications:  Medication Instructions Status  aspirin 81 mg oral delayed release tablet 1 tab(s) orally once a day Active  levothyroxine 125 mcg (0.125 mg) oral tablet 1 tab(s) orally once a day Active  lisinopril 40 mg oral tablet 1 tab(s) orally once a day Active  ALPRAZolam 0.25 mg oral tablet 1 tab(s) orally once a day Active  amlodipine 10 mg oral tablet 1 tab(s) orally once a day Active   EKG:   EKG NSR     Abnormal NSSTTW changes    Radiology Results:  XRay:    20-Nov-13 17:08, Chest PA and Lateral   Chest PA and Lateral    REASON FOR EXAM:    SOB  COMMENTS:       PROCEDURE: DXR - DXR CHEST PA (OR AP) AND LATERAL  - Aug 03 2012  5:08PM     RESULT: Port-A-Cath in good position. Borderline cardiomegaly. No  congestive heart failure. Lungs clear of infiltrates.    IMPRESSION:  No acute pulmonary disease. Port-A-Cath in good position.        Verified By: Gwynn BurlyHOMAS E. REGISTER, M.D., MD    [phone]-Nov-13 21:55, Abdomen Flat and Erect   Abdomen Flat and Erect    REASON FOR EXAM:    abd pain  COMMENTS:       PROCEDURE: DXR - DXR ABDOMEN 2 V FLAT AND ERECT  - Aug 03 2012  9:55PM     RESULT: Gastrostomy tube is present. There are distended loops of small   bowel. There is air and fecal material in the colon with a small amount   of air in the rectum. There is no definite free air evident. There is no   pneumatosis. The lung bases appear clear.    IMPRESSION:  Nonspecific bowel gas pattern. Correlate for ileus or   partial small bowel obstruction. An evolving complete obstruction is not   excluded. CT of the abdomen and pelvis may be beneficial utilizing oral   and IV contrast.  Dictation Site: 2        Verified By: Elveria RoyalsGEOFFREY H. BROWNE, M.D., MD  Cardiology:     20-Nov-13 19:43, ED ECG   Ventricular Rate 107   Atrial Rate 107   P-R Interval 150   QRS Duration 92   QT 358   QTc 477   P Axis 66   R Axis 8   T Axis 46   ECG interpretation    Sinus tachycardia with Premature supraventricular complexes  Otherwise normal ECG  When compared with ECG of 15-Jul-2012 18:23,  Premature supraventricular complexes are now Present  Borderline criteria for Inferior infarct are no longer Present  ----------unconfirmed----------  Confirmed by OVERREAD, NOT ([id number]), editor PEARSON, BARBARA ([id number]) on 08/04/2012 8:22:38 AM   ED ECG     No Known Allergies:     Impression 74 yo male with history of laryngeal carcinoma s/p laryngectomy who was admitted with progressive sputum production and blood tinged sputum from tracheostomy site. He was hemodynaically stable. Had a mild tropoinin elevation to 0.1 with normal ekg and no chest pain. Does not appear to have had an acute ischemic event based on minimal troponin elevation, unremarkable ekg and lack of ischemic symptoms. Based on this, would not recommend proceeding with invasive cardiac evaluation at this point. Will review echo to evaluate wall motion to guide further medical therapy. Would not anticoagulate with heparin at present given bleeding risk from stoma. Agree with asa and beta blockers and stain. Continue to treat with abx for probable uri.    Plan 1. Continue with medical therapy with asa, beta blockers and statin. 2. Defer cardiac cath at present 3. Will review echo when available to guide further medical treatment 4. Continue with abx 5. Agree with ENT consult 6. Will follow with you   Electronic Signatures: Dalia HeadingFath, Maraya Gwilliam A (MD)  (Signed 21-Nov-13 13:27)  Authored: General Aspect/Present Illness, History and Physical Exam, Review of System, Past Medical History, Home Medications, EKG , Radiology, Allergies, Impression/Plan   Last Updated: 21-Nov-13 13:27 by Dalia HeadingFath, Emannuel Vise A (MD)

## 2015-01-01 NOTE — Op Note (Signed)
PATIENT NAME:  Mark [name], Mark [name] MR#:  [id number] DATE OF BIRTH:  [birth year]  DATE OF PROCEDURE:  04/11/2012  PREOPERATIVE DIAGNOSES:  1. Recurrent tongue/head and neck cancer with poor venous access.  2. Status post laryngectomy.   PREOPERATIVE DIAGNOSES:  1. Recurrent tongue/head and neck cancer with poor venous access.  2. Status post laryngectomy.   PROCEDURE: 1. Ultrasound guidance for vascular access, right internal jugular vein.  2. Fluoroscopic guidance for placement of catheter.  3. Placement of a CT-compatible Infuse-a-Port right jugular vein.   SURGEON: Annice NeedyJason S. Kazim Corrales, M.D.   ANESTHESIA: Local with moderate conscious sedation.   ESTIMATED BLOOD LOSS: Minimal.   FLUOROSCOPY TIME: Approximately one minute.   CONTRAST USED: None.   INDICATION FOR PROCEDURE: This is a 74 year old African American male with recurrent head and neck cancer. He needs a Port-A-Cath for chemotherapy.   DESCRIPTION OF PROCEDURE:   The patient was brought to the vascular interventional radiology suite. The right neck and chest were sterilely prepped and draped and  a sterile surgical field was created. The right jugular vein was visualized, although it did not compress easily and was smaller than typical, it did appear to be patent. It likely is somewhat fibrotic from his previous surgery and head and neck radiation as well. It was accessed with a micropuncture needle with return of blood present. Micropuncture wire and sheath were placed.  I then up-sized to a 0.035 wire and placed a peel-away sheath. I then anesthetized an area below the right clavicle and a transverse incision was created. An inferior pocket was performed and the port was secured to the chest wall with 2 Prolene sutures. It was connected to the catheter and tunneled from the subclavicular incision to the access site. Fluoroscopic guidance was used to  cut the catheter to an appropriate length. It was then placed through the peel-away sheath  and the peel-away sheath was removed. The subclavicular incision was closed with 3-0 Vicryl and 4-0 Monocryl. The access incision was closed with a single 4-0 Monocryl. A Huber needle was used to access the port. It withdrew blood well and flushed easily with heparinized saline. The catheter tip was located just into the right atrium. Dermabond was then placed. The patient tolerated the procedure well and was taken to the recovery room in stable condition.     ____________________________ Annice NeedyJason S. Herbert Marken, MD jsd:bjt D: 04/11/2012 11:41:10 ET T: 04/11/2012 12:57:16 ET JOB#: [id number]  cc: Annice NeedyJason S. Terrin Imparato, MD, <Dictator> Annice NeedyJASON S Ottis Sarnowski MD ELECTRONICALLY SIGNED 04/14/2012 13:52

## 2015-01-04 NOTE — Discharge Summary (Signed)
Dates of Admission and Diagnosis:  Date of Admission 20-Dec-2012   Date of Discharge 23-Dec-2012   Admitting Diagnosis Myelosuppression with possible sepsis and  SIRS   Final Diagnosis Myelosuppression with sepsis (Klebsiella pneumoniae)   Discharge Diagnosis 1 Hypotension and hypoxia   2 Renal insufficiency acute and chronic   3 Recurrent squamous cell carcinoma of base of tongue    Chief Complaint/History of Present Illness Diagnosis: ?? Chief Complaint/Diagnosis  1. Status post laryngectomy and postop chemoradiation back in the 80s for laryngeal squamous cell. 2. 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   Locally advanced tumor unresectable because of multiple medical condition.  T3, N0, M0 tumor(clinically staged based on PET scan). 3. Starting concurrent radiation and chemotherapy from 31st of July 2013.   4. Has finished radiation and chemothery, October of 2013. 5.biopsies positive for recurrent squamous cell carcinoma locally advanced disease March of 2014 6.  Patient was started on chemotherapy with carboplatin and Taxol and cetuximab  from April of 2014   Chief Complaint/History of Present Illness cont'd ?? HPI  patient is a 74 year old male treated back in 4 months prior for recurrent head and neck cancer. patient is here with progressively declining condition.  Increasing shortness of breath.  Severely hypoxic  Hypotensive .  Neutropenia.  Has acute renal insufficiency.abnormal liver enzymes.  In we will follow this finding patient is now being admitted in the hospital for IV fluid IV antibiotics  According to family this and started declining few days ago.  He received chemotherapy with carboplatinum Taxol and cetuximablast week   Routine BB:  09-Apr-14 08:37   Platelets (Blood Component) Transfused  Result(s) reported on 23 Dec 2012 at 06:07AM.  Platelets (Blood Component) Transfused  Result(s) reported on 22 Dec 2012 at  06:14AM.  ABO Group + Rh Type B Positive  Result(s) reported on 21 Dec 2012 at 09:12AM.  Routine Chem:  09-Apr-14 04:05   Result Comment WBC/PLATELET - NOTIFIED OF CRITICAL VALUE  - RESULTS VERIFIED BY REPEAT TESTING.  - CALLED TO TRACEY OAKLEY:12/21/12@0549   - READ-BACK PROCESS PERFORMED.  - TPL PLATELETS - VERIFIED BY SMEAR ESTIMATE WBC - DUE TO THE LOW WBC, THE INSTRUMENT DIFF  - CANNOT BE CONFIRMED BY MANUAL DIFF AND  - IS REPORTED PRIMARILY TO IDENTIFY CELL  - TYPES PRESENT.  - TPL  Result(s) reported on 21 Dec 2012 at 05:51AM.  Glucose, Serum  230  BUN  44  Creatinine (comp)  1.45  Sodium, Serum  133  Potassium, Serum 4.0  Chloride, Serum 98  CO2, Serum 24  Calcium (Total), Serum  7.3  Anion Gap 11  Osmolality (calc) 285  eGFR (African American)  55  eGFR (Non-African American)  47 (eGFR values <64mL/min/1.73 m2 may be an indication of chronic kidney disease (CKD). Calculated eGFR is useful in patients with stable renal function. The eGFR calculation will not be reliable in acutely ill patients when serum creatinine is changing rapidly. It is not useful in  patients on dialysis. The eGFR calculation may not be applicable to patients at the low and high extremes of body sizes, pregnant women, and vegetarians.)  11-Apr-14 05:18   Result Comment CBC - DUE TO THE LOW WBC, THE INSTRUMENT DIFF  - CANNOT BE CONFIRMED BY MANUAL DIFF AND  - IS REPORTED PRIMARILY TO IDENTIFY CELL  - TYPES PRESENT.  - SLIDE SCANNED BY HKP....TPL PLATELETS - RESULTS VERIFIED BY REPEAT TESTING.  - VERIFIED BY SMEAR  ESTIMATE WBC/PLT - CRITICAL VALUE PREVIOUSLY NOTIFIED.  - TPL  Result(s) reported on 23 Dec 2012 at 07:11AM.  Glucose, Serum  246  BUN  30  Creatinine (comp) 0.84  Sodium, Serum 137  Potassium, Serum  3.4  Chloride, Serum 102  CO2, Serum 29  Calcium (Total), Serum  7.8  Anion Gap  6  Osmolality (calc) 288  eGFR (African American) >60  eGFR (Non-African American) >60 (eGFR  values <29m/min/1.73 m2 may be an indication of chronic kidney disease (CKD). Calculated eGFR is useful in patients with stable renal function. The eGFR calculation will not be reliable in acutely ill patients when serum creatinine is changing rapidly. It is not useful in  patients on dialysis. The eGFR calculation may not be applicable to patients at the low and high extremes of body sizes, pregnant women, and vegetarians.)  Routine Hem:  09-Apr-14 04:05   WBC (CBC)  0.3  RBC (CBC)  3.67  Hemoglobin (CBC)  11.4  Hematocrit (CBC)  34.2  Platelet Count (CBC)  16  MCV 93  MCH 31.2  MCHC 33.4  RDW  19.5  Neutrophil % 84.1  Lymphocyte % 11.4  Monocyte % 4.0  Eosinophil % 0.2  Basophil % 0.3  Neutrophil #  0.3  Lymphocyte #  0.0  Monocyte #  0.0  Eosinophil # 0.0  Basophil # 0.0  11-Apr-14 05:18   WBC (CBC)  1.1  RBC (CBC)  3.34  Hemoglobin (CBC)  10.3  Hematocrit (CBC)  30.7  Platelet Count (CBC)  19  MCV 92  MCH 30.8  MCHC 33.6  RDW  19.9  Neutrophil % 95.0  Lymphocyte % 2.4  Monocyte % 2.2  Eosinophil % 0.2  Basophil % 0.2  Neutrophil #  1.0  Lymphocyte #  0.0  Monocyte #  0.0  Eosinophil # 0.0  Basophil # 0.0   PERTINENT RADIOLOGY STUDIES: LabUnknown:    08-Apr-14 12:45, Blood Culture  ERTAPENEM1 S  CEFOXITIN R    08-Apr-14 13:00, Blood Culture  ERTAPENEM1 S  CEFOXITIN R   Hospital Course:  Hospital Course During hospital stay patient was started on IV antibiotics. Subcutaneous Neupogen.  And all supportive therapy with IV fluid.  Blood culture duplex about pneumoniae antibiotics were changed.   PATIENT'S general condition continued to decline.  Had prolonged discussion with the family.  They did not want aggressive therapy They  wanted patient returns for hospice care per supportive therapy. Considering ffailure  of multiple surgical treatment as well as chemotherapy program it was considered appropriate for palliative care.  And patient was  transferred to hospice home   [place]:  Medication Reconciliation: Patient's Home Medications at Discharge:     Medication Instructions  alprazolam 0.25 mg oral tablet  1 tab(s) orally 3 times a day as needed for Anxiety, Nervousness.  via peg tube   levothyroxine 125 mcg (0.125 mg) oral tablet  1 tab(s) orally once a day (in the morning) via peg tube   lisinopril 40 mg oral tablet  1 tab(s) orally once a day (in the morning) via peg tube   docusate  100 milligram(s)  2 times a day   jevity 1 cal bolus feed  237 milliliter(s)     omeprazole 20 mg oral delayed release capsule  1 cap(s) orally once a day via PEG tube   morphine 20 mg/ml oral concentrate  0.5-1 milliliter(s) via PEG or oral every 2 hours, As Needed     Physician's  Instructions:  Home Health? No   Treatments None   Dressing Care Tracheostomy care   Diet PEG tube feeding   Dietary Supplements Ensure   Activity Limitations As tolerated   Return to Work Not Applicable   Time frame for Follow Up Appointment As needed   Electronic Signatures: Jobe Gibbon (MD)  (Signed 21-Apr-14 17:21)  Authored: ADMISSION DATE AND DIAGNOSIS, CHIEF COMPLAINT/HPI, PERTINENT LABS, [place], PATIENT INSTRUCTIONS   Last Updated: 21-Apr-14 17:21 by Jobe Gibbon (MD)

## 2015-01-04 NOTE — Discharge Summary (Signed)
PATIENT NAME:  Mark [name], Mark [name] MR#:  [id number] DATE OF BIRTH:  1941-02-16  DATE OF ADMISSION:  09/25/2012 DATE OF DISCHARGE:  09/26/2012  HISTORY OF PRESENT ILLNESS: Please see admission history and physical. 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 He also has a history of hypothyroidism and hypertension. He was admitted with the complaint of some abdominal pain and palpitations. When he was seen in the ED, he had evidence on exam and on CT of possible partial small bowel obstruction versus ileus.   HOSPITAL COURSE BY ISSUE:  1. Partial small bowel obstruction. The patient was seen by Dr. Excell Seltzerooper. He was kept n.p.o., had a PEG tube placed to suction. Serial exams and imaging improved. He moved his bowels two times. He tolerated G-tube feeding continuously as well as bolus. The patient is adamant that he be discharged home today. He says he feels fine. We will discharge him home with outpatient follow-up.  2. Hypertension. Blood pressure was markedly elevated on admission, but has improved since he restarted his lisinopril. We will continue him on this and follow him up in one week's time.  3. Hypothyroidism. This is stable. He continues on Synthroid.  4. Laryngeal cancer with recurrence. Following with Dr. Doylene Canninghoksi. We will arrange outpatient follow-up.   DISCHARGE MEDICATIONS: 1. Lisinopril 40 mg once a day.  2. Levothyroxine 125 once a day.  3. Aspirin 81 mg once a day.  4. Pepcid 20 mg once a day.  5. Zofran 4 mg q. 6 hours p.r.n. nausea and vomiting.  DISCHARGE FOLLOW-UP: The patient will follow up in 1 to 2 weeks and with his primary care physician.  DIET: The patient continues to use Jevity, two cans at a time, with 8 cans a day.    DISCHARGE ACTIVITY: As tolerated.   TIME SPENT: 35 minutes.  ____________________________ Stann Mainlandavid P. Sampson GoonFitzgerald, MD dpf:sb D: 09/26/2012 12:38:06 ET T: 09/26/2012 12:44:42 ET JOB#: [id number]  cc: Stann Mainlandavid P.  Sampson GoonFitzgerald, MD, <Dictator> Gerome SamJanak K. Choksi, MD Adah Salvageichard E. Excell Seltzerooper, MD Yaremi Stahlman [facility] ThomasFITZGERALD MD ELECTRONICALLY SIGNED 10/04/2012 19:23

## 2015-01-04 NOTE — Op Note (Signed)
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   PROCEDURE PERFORMED: Suspension microlaryngoscopy, with biopsies.   ANESTHESIA: General endotracheal anesthesia.  ESTIMATED BLOOD LOSS: 15 mL.   IV FLUIDS: Please see anesthesia record.   COMPLICATIONS: None.   DRAINS AND STENT PLACEMENTS: None.   SPECIMENS: Base of tongue, and pharyngeal wall biopsy.   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 He was found to have a second primary of squamous cell carcinoma of the base of tongue status post radiation therapy, with persistent disease on PET scan, presenting to the operating room for evaluation.   OPERATIVE FINDINGS: Significant fibrinous debris filling his entire posterior oropharynx. When this was removed there was a large, friable tumor filling his entire base of tongue and extending onto his lateral pharyngeal and posterior pharyngeal wall. Given the patient's prior history of esophageal stenosis, esophagoscopy was unable to be performed. The neopharynx was completely involved with tumor.   DESCRIPTION OF PROCEDURE: After the patient was identified in holding, the benefits and risks of the procedure were discussed and consent was reviewed. The patient was taken to the operating room and placed in the supine position. General endotracheal anesthesia was induced through a small endotracheal tube in the patient's already-existing stoma. At that point the  patient was rotated 90 degrees. A wet Ray-Tec was placed in the patient's oral cavity as the patient was edentulous, and a  Dedo laryngoscope was brought into the field. Visualization of the patient's posterior oropharynx revealed a significant amount of fibrinous exudate completely filling his entire posterior oropharynx. This was debulked with suction revealing a friable tumor along the patient's left posterior and lateral pharyngeal wall extending down to his base of tongue and extending and involving his neopharynx from his previous total laryngectomy. This extended across midline to the patient's right base of tongue. The right pharyngeal wall was somewhat spared compared to the left. Multiple biopsies were taken of the base of tongue. There was a significant amount of necrotic tissue in this area. This was passed off of the table for permanent pathological evaluation. Hemostasis was achieved with Afrin-soaked pledgets, and at this time care of the patient was transferred to anesthesia where the patient tolerated the procedure well.    ____________________________ Kyung Ruddreighton C. Danniela Mcbrearty, MD ccv:dm D: 12/05/2012 08:24:00 ET T: 12/05/2012 08:38:09 ET JOB#: [id number]  cc: Kyung Ruddreighton C. Franck Vinal, MD, <Dictator> Kyung RuddREIGHTON C Caspar Favila MD ELECTRONICALLY SIGNED 12/09/2012 17:49

## 2015-01-04 NOTE — Consult Note (Signed)
PATIENT NAME:  Mark [name], Mark [name] MR#:  [id number] DATE OF BIRTH:  [birth year]  DATE OF CONSULTATION:  12/11/2012  REFERRING PHYSICIAN:  Huey Bienenstockawood Elgergawy, MD   CONSULTING PHYSICIAN:  Lamar BlinksBruce J. Kowalski, MD  REASON FOR CONSULTATION: Acute congestive heart failure, hypertension, leg edema, abnormal EKG.   CHIEF COMPLAINT: "I have swelling in my legs."  HISTORY OF PRESENT ILLNESS: This is a 74 year old male with known significant lung cancer and throat cancer, status post resection . The patient recently just had an echocardiogram and stress test which appear to be normal, and has had no events of congestive heart failure, but had new onset lower extremity edema. The patient has had the biopsy, which he tolerated well. The patient currently has had no issues of chest discomfort, syncope, dizziness, nausea or diaphoresis but does have weakness and fatigue. There is no current evidence of chest pressure or angina.   REVIEW OF SYSTEMS: The remainder is negative for vision change, ringing in the ears, hearing loss, cough, congestion, heartburn, nausea, vomiting, diarrhea, bloody stools, stomach pain, extremity pain, leg weakness, cramping of the buttocks, known blood clots, headaches, blackouts, dizzy spells, nosebleeds, congestion, trouble swallowing, frequent urination, urination at night, muscle weakness, numbness, anxiety, depression, skin lesions or skin rashes.   PAST MEDICAL HISTORY: 1. Throat cancer.  2. Hypertension.   FAMILY HISTORY: No family members with early onset of cardiovascular disease or hypertension.   SOCIAL HISTORY: The patient smokes 1 pack per day for 30 years and quit in 1984. He denies alcohol use.   ALLERGIES: As listed.   MEDICATIONS: As listed.  PHYSICAL EXAMINATION: VITAL SIGNS: Blood pressure is 100/62 bilaterally, heart rate 70 upright, reclining, and regular.  GENERAL: He is a well-appearing male in no acute distress.  HEENT: No icterus, thyromegaly, ulcers,  hemorrhage or xanthelasma.  CARDIOVASCULAR: Regular rate and rhythm. Normal S1 and S2 without a murmur, gallop, or rub. PMI is diffuse. Carotid upstroke is normal without bruit. Jugular venous pressure is normal.  LUNGS: Lungs have a few basilar crackles with normal respirations.  ABDOMEN: Soft, nontender without hepatosplenomegaly or masses. Abdominal aorta is normal size without bruit.  EXTREMITIES: 2+ radial, femoral, dorsal pedal pulses with 1 to 2+ lower extremity edema. No cyanosis, clubbing or ulcers.  NEUROLOGIC: He is oriented to time, place, and person with normal mood and affect.   ASSESSMENT: A 74 year old male with hypertension and lower extremity edema of unknown etiology, currently possibly congestive heart failure with re-evaluation.   RECOMMENDATIONS: 1. Intravenous Lasix for lower extremity edema.  2. Further evaluation of electrolyte abnormalities.  3. Possible repeat echocardiogram for LV systolic dysfunction, chronic pulmonary heart disease and pulmonary hypertension causing lower extremity edema with address of further issues.  4. No repeat of stress test, which was normal.  5. Further treatment options after above.   ____________________________ Lamar BlinksBruce J. Kowalski, MD bjk:cb D: 12/11/2012 19:00:36 ET T: 12/11/2012 20:38:46 ET JOB#: [id number]  cc: Lamar BlinksBruce J. Kowalski, MD, <Dictator> Lamar BlinksBRUCE J KOWALSKI MD ELECTRONICALLY SIGNED 12/19/2012 8:02

## 2015-01-04 NOTE — Consult Note (Signed)
Brief Consult Note: Diagnosis: partial SBO vs. constipation.   Patient was seen by consultant.   Recommend further assessment or treatment.   Discussed with Attending MD.   Comments: CC is heart palpitations. Is in NSR no afib. Minimal abd pain, unable to vomit, not nauseated. Is having flatus, occ BM. Abd pain for 2 days. No F/C. PE nondistended, nontender, nontympanitic CT reviewed, gas throughout colon and rectum G tube to suction Will follow if admitted by PD.  Electronic Signatures: Lattie Hawooper, Deveon Kisiel E (MD)  (Signed 12-Jan-14 05:44)  Authored: Brief Consult Note   Last Updated: 12-Jan-14 05:44 by Lattie Hawooper, Aryani Daffern E (MD)

## 2015-01-04 NOTE — H&P (Signed)
PATIENT NAME:  Mark [name], Mark [name] MR#:  [id number] DATE OF BIRTH:  May 21, 1941  DATE OF ADMISSION:  09/25/2012  PRIMARY CARE PHYSICIAN: Dr. Sampson Goon  ONCOLOGIST: Dr. Doylene Canning   CHIEF COMPLAINT: Palpitations and abdominal pain.   HISTORY OF PRESENT ILLNESS: This is a 74 year old African American male with a history of laryngeal cancer, status post laryngectomy over a decade ago, who was just found to have recurrence in June 2013 with squamous cell carcinoma at the base of the tongue, status post chemotherapy and radiation, as well as history of hypothyroidism, COPD and chronic stoma and PEG tube. The patient is unable to provide much information as he does not have his speaking valve currently, and he is using a microphone, so most of the history in the HPI was obtained from ED staff and physician. The patient appears he came for complaints of palpitations. The patient was found to have normal sinus rhythm without any tachycardia; and the patient, upon questioning by ED staff, was complaining of abdominal pain, so he had CT abdomen and pelvis done without contrast which did show evidence of possible mechanical small bowel obstruction. The patient was seen and evaluated by Dr. Excell Seltzer from surgical service, who reports the patient is well known to him, where he had previous presentation in June 2013 for similar complaints, where he was found to have fecal impaction. He reports given the fact the patient is having benign abdominal exams, he thinks this is more likely ileus versus partial small bowel obstruction. As mentioned earlier, the patient currently cannot provide appropriate history, but he reports he has some complaints of nausea but there is no vomiting, chest pain, or shortness of breath. The patient had his G-tube hooked up to intermittent low suction without any drainage coming out.   The patient had mildly elevated troponin at 0.1. By reviewing his old labs, it appears to be currently elevated and  this is at his baseline.  Hospitalist service was requested to admit the patient for further evaluation and management of his ileus.   PAST MEDICAL HISTORY:  1. History of cancer of the larynx, status post laryngectomy more than 12 years ago, with recurrence at the base of the tongue, status post chemotherapy and radiation.  2. Hypertension.  3. Hypothyroidism.  4. Anxiety.  5. COPD.  6. History of sigmoid diverticulosis.  7. Status post PEG and trach.   SOCIAL HISTORY: Has remote history of smoking, quit. No alcohol or drug use.   FAMILY HISTORY: Sister has diabetes. Mother has hypertension.   HOME MEDICATIONS: Lisinopril 40 mg oral daily, Levothyroxine 125 mcg oral daily, Famotidine 20 mg daily, aspirin 81 mg daily, Tylenol as needed.   REVIEW OF SYSTEMS: Unable to obtain fully given the fact the patient is unable to speak, but he was able to shake his head and: CONSTITUTIONAL: Denies any fever or chills. Complains of generalized weakness, denies blurry vision or vision problem.  ENT: Denies tinnitus, ear pain, epistaxis.  RESPIRATORY: Denies cough, hemoptysis, shortness of breath.  CARDIOVASCULAR: Denies chest pain, edema. Has complaints of some heart racing. Denies any altered mental status and syncope.  GASTROINTESTINAL: Complains of nausea, no vomiting, diarrhea. No constipation. No hematemesis. Has complaints of mild abdominal pain.  GENITOURINARY: Denies any urinary problems, dysuria or hematuria.  ENDOCRINE: No polyuria, no nocturia.  HEMATOLOGY: Denies easy bruising or bleeding.  INTEGUMENTARY: Denies acne, rash.  MUSCULOSKELETAL: Denies any gout, any arthritis, or cramps.  NEUROLOGICAL: Denies any epilepsy, vertigo, ataxia, or seizures.  PSYCHIATRIC:  Denies any substance or alcohol abuse, any insomnia. Reports some anxiety.   PHYSICAL EXAMINATION: VITAL SIGNS: Temperature 98.5, pulse 88, respiratory rate 18, blood pressure 226/128.  GENERAL: A well-developed male, is  comfortable and in no apparent distress.  HEENT: Head atraumatic, normocephalic. Pupils are equal, reactive to light. Pink conjunctivae. Anicteric sclerae. Moist oral mucosa. Unable to open mouth fully and pull the tongue all the way out. He is edentulous, could not be examined appropriately for thrush.  NECK: Supple. No thyroid tenderness. There is a trach stoma site without any significant drainage or bleed.  CARDIOVASCULAR: S1, S2 heard. No rubs, murmur, or gallops.  PULMONARY: Good air entry bilaterally. No wheezing, rales, or rhonchi.  ABDOMEN: Soft, nontender, nondistended. Bowel sounds present. PEG is present in the left upper quadrant, intact and no drainage or bleed from the site.  EXTREMITIES: No edema. No clubbing. No cyanosis.  NEUROLOGICAL: Cranial nerves are grossly intact. Motor 5 out of 5.  PSYCHIATRIC: Awake, alert. I am unable to obtain appropriate answers for location and date, but the patient seems to be coherent with intact judgment and insight.  SKIN: No rash. Normal skin turgor. Warm and dry.   PERTINENT LABORATORY, DIAGNOSTIC AND RADIOLOGICAL DATA:  Glucose  99, BUN 20, creatinine 1.16, sodium 134, potassium 3.9, chloride 97, CO2 30.0. Troponin 0.1. White blood cells 5.9, hemoglobin 12, hematocrit 38.2, platelets 259,000.   CT of abdomen and pelvis showing small bowel obstruction and cholelithiasis.   ASSESSMENT AND PLAN: 1. Ileus versus small bowel obstruction: Surgery help appreciated. Dr. Excell Seltzerooper has seen and evaluated the patient. I think this is most likely related to ileus versus partial small bowel obstruction. The patient will be kept n.p.o. His PEG tube will be on intermittent low suction. Surgery already ordered some KUBs this afternoon, and they will decide the point when to advance feeding.   2. History of laryngeal cancer with recurrence at the base of the tongue: The patient is followed by Dr. Doylene Canninghoksi as an outpatient. I will continue with Orajel and Duke's  mouthwash.  3. Chronically elevated troponin, appears to be at baseline: No chest pain or shortness of breath.  4. Hypertension, uncontrolled: I will resume the patient on lisinopril and will have him on IV hydralazine as needed.  5. Hypothyroidism: Continue with Synthroid.  6. Deep vein thrombosis prophylaxis: Subcutaneous heparin.  7. Gastrointestinal prophylaxis: On PPI.   TOTAL TIME SPENT ON ADMISSION AND PATIENT CARE: 50 minutes.  ____________________________ Starleen Armsawood S. Marquice Uddin, MD dse:cb D: 09/25/2012 07:12:00 ET T: 09/25/2012 15:02:42 ET JOB#: [id number]  cc: Starleen Armsawood S. Quaneshia Wareing, MD, <Dictator> Konner Warrior Teena IraniS Torey Reinard MD ELECTRONICALLY SIGNED 09/26/2012 0:46

## 2015-01-04 NOTE — Consult Note (Signed)
PATIENT NAME:  Mark [name], Mark [name] MR#:  [id number] DATE OF BIRTH:  1941/05/15  DATE OF CONSULTATION:  09/25/2012  REFERRING PHYSICIAN:   CONSULTING PHYSICIAN:  Mark [name], [name]  CHIEF COMPLAINT: Abdominal pain.   HISTORY OF PRESENT ILLNESS: This is a patient with 2 days of abdominal pain. He has had a total laryngectomy and speaks with the aid of an electromechanical device and is somewhat difficult to understand, but he describes 2 days of abdominal pain. He points to the mid epigastrium. He has had no nausea or vomiting. He is unable to vomit and has a G-tube in place where he gets all of his nutrition. He is passing gas and had a bowel movement yesterday. Denies fevers or chills.  In researching his records, it is noted that I had seen this patient back in June 2013 with a very similar, if not identical, situation, which resolved spontaneously and was likely due to constipation.   PAST MEDICAL HISTORY: Laryngeal cancer with recurrence, hypertension, hypothyroidism, COPD, constipation and diverticulitis.   PAST SURGICAL HISTORY: Gastrostomy and tracheostomy.   SOCIAL HISTORY: The patient no longer smokes. He does not drink alcohol and is unemployed.   REVIEW OF SYSTEMS: A 10 system review is performed and difficult with the communication issue, but negative with the exception of that mentioned in the HPI.   PHYSICAL EXAMINATION:  GENERAL: Chronically ill-appearing patient. He again speaks with the aid of an electromechanical device.  VITAL SIGNS: Stable. He is afebrile.  HEENT: No scleral icterus. Tracheostomy in place and signs of prior neck surgery and radiation.  CHEST: Clear to auscultation.  CARDIAC: Regular rate and rhythm.  ABDOMEN: Soft, nondistended. G-tube is in place. Non-tympanitic and nontender.  EXTREMITIES: Without edema. Calves are nontender.  NEUROLOGIC: Grossly intact.  INTEGUMENT: No jaundice.   Heart rate in the 90s and regular, normal sinus rhythm: CT scan is  personally reviewed and it looks very similar to the CT scan back in June in which he had some constipation, especially right-sided stool and no free air. There are some distended loops of small bowel.   ASSESSMENT AND PLAN: Possible partial small bowel obstruction. A similar episode to what he had 6 months ago. I recommended that medicine see the patient for his palpitations and other medical problems and that he be admitted for observation. Serial exams will be performed and a G-tube will be placed to suction. I have discussed this with primary doctor and with the Emergency Room physician, Dr. Ether GriffinsFowler.     ____________________________ Mark [name], [name] rec:aw D: 09/25/2012 19:44:29 ET T: 09/26/2012 07:56:03 ET JOB#: [id number]  cc: Mark [name], [name], <Dictator> Mark [name] ELECTRONICALLY SIGNED 10/03/2012 9:51

## 2015-01-04 NOTE — H&P (Signed)
PATIENT NAME:  Mark [name], Mark [name] MR#:  [id number] DATE OF BIRTH:  1941/06/18  DATE OF ADMISSION:  12/11/2012  PRIMARY CARE PHYSICIAN:  Dr. Sampson Goon.  REFERRING PHYSICIAN:  Dr. Zenda Alpers.   CHIEF COMPLAINT:  Shortness of breath and lower extremity edema.   HISTORY OF PRESENT ILLNESS:  The patient is a 74 year old African American male with a past medical history of laryngeal cancer status post laryngectomy, status post tracheostomy and PEG tube placement in the past with multiple other medical problems is presenting to the ER with a chief complaint of shortness of breath and bilateral lower extremity edema.  The patient is in his usual state of health a few weeks ago, but slowly he has been experiencing intermittent episodes of shortness of breath and then started experiencing lower extremity swelling.  His bilateral lower extremities are so swollen he was brought into the ER by his wife.  The patient also was just recently admitted to the hospital January of 2014 and following the discharge he was seen by his cardiologist Dr. Lady Gary who did stress test approximately 10 days ago.  The patient is reporting he does not know the stress test results.  He follows with Dr. Doylene Canning regarding laryngeal cancer and on Decadron regarding that.  The patient had bilateral lower extremity venous Dopplers done which are pending at this time.  Today, patient's sodium is at 126.  The patient's BNP in the ER is at 85 with a sodium of 126, which has drop to 126 from March 24th, of this year, which was at 132.  In the ER patient's trach site was cleansed with normal saline and deep suctioning was done following which patient started feeling better regarding his shortness of breath.  The patient denies any complaints of chest tightness or pain.  Denies any nausea, vomiting, diarrhea.  Denies any abdominal pain.   PAST MEDICAL HISTORY:  1.  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  2.  Hypertension.  3.  Hypothyroidism.  4.  Anxiety.  5.  COPD.   6.  History of sigmoid diverticulosis.  7.  History of hospitalization for poor by mouth intake and electrolyte imbalance in the past status post PEG and tracheostomy.   PAST SURGICAL HISTORY:  Status post laryngectomy, PEG tube placement and a tracheostomy.   ALLERGIES:  The patient has no known drug allergies.   HOME MEDICATIONS:  Lisinopril 40 mg once daily, levothyroxine 125 mcg once daily, Klor-Con 20 mEq 2 times a day, famotidine 20 mg 2 times a day, dexamethasone 4 mg 1 tablet 2 times a day, aspirin 81 mg once daily, alprazolam 0.25 mg 1 tablet by mouth q. 8 hours, Advil 200 mg by mouth.   PSYCHOSOCIAL HISTORY:  Lives at home with wife.  He used to smoke, but quit smoking several years ago.  Denies any alcohol or illicit drug usage.   FAMILY HISTORY:  Sister with diabetes mellitus.  Mom with hypertension.   REVIEW OF SYSTEMS:  We are having some technical difficulties as the patient unable to speak and he speaks with the help of an instrument.  We took his wife's help regarding the conversation.  CONSTITUTIONAL:  Denies any fever, fatigue.  Gained weight with lower extremity edema.  Denies any chest pain.  EYES:  No blurry vision, glaucoma, cataracts.  EARS, NOSE, THROAT:  No tinnitus, ear pain, discharge, or difficulty in swallowing.  RESPIRATION:  Denies any cough, wheezing,  but complaining of intermittent episodes of shortness of breath.  No varicose veins.  Denies any syncope.  GASTROINTESTINAL:  No nausea, vomiting, diarrhea.  Denies any abdominal pain, hematemesis.  No change in bowel habits.  GENITOURINARY:  Denies any hematuria or renal calculi.  ENDOCRINE:  No polyuria, nocturia.  He denies any diabetes mellitus.  He has chronic history of hypothyroidism.  INTEGUMENTARY:  No acne, rash, lesions.  MUSCULOSKELETAL:  No joint pain in the neck, back, shoulders,  NEUROLOGIC:   Denies vertigo, ataxia, dementia.  No CVA in the past.  PSYCHIATRIC:  No ADD, OCD, bipolar disorder.   PHYSICAL EXAMINATION: VITAL SIGNS:  Temperature is 98.5, pulse 94, respirations 18, blood pressure 178/113, pulse ox 97%.  GENERAL APPEARANCE:  Not under acute distress, moderately built and moderately nourished.  HEENT:  Normocephalic, atraumatic.  Pupils are equally reactive to light and accommodation. EOM intact. No scleral icterus.  No conjunctival injection, no sinus tenderness.  No postnasal drip. No pharyngeal exudates. Oropharynx is moist. NECK:  Supple.  No JVD.  Janina Mayo site is intact.  It looks dried up. No lymphadenopathy. RESPIRATORY: Moderate air entry. positive rales at bases. No anterior chest wall tenderness on palpation and no accessory muscle usage.  CARDIAC:  S1, S2 normal.  Regular rate and rhythm.  GASTROINTESTINAL:  Soft.  Bowel sounds are positive in all four quadrants.  Nontender, nondistended.  PEG site is intact. No masses. No hepatosplenomegaly. NEUROLOGIC:  Awake, alert and oriented x 2 to 3.  Motor and sensory are grossly intact.  Reflexes are 2+.  Cranial nerves II through XII are intact.  EXTREMITIES:  2 to 3+ pitting edema is present.  Also it is tender to touch.  Positive calf tenderness.  Peripheral pulses are 1+. No cyanosis and clubbing. PSYCHIATRIC:  Normal mood and affect.  SKIN:  With no lesions, rashes. Normal turgour, warm to touch.  LABORATORY DATA AND IMAGING STUDIES:  WBC is 9.5, hemoglobin is 12.8, hematocrit 39.0, platelet count 131,000, RDW is 18.6, glucose is 189.  BNP is 85, BUN is 27, creatinine 1.1, sodium is 126, potassium 4.4, chloride 91, CO2 is 20.  A 12-lead EKG has revealed sinus tachycardia.  Normal PR and QRS intervals, nonspecific ST-T wave changes.  Chest x-ray has revealed no acute findings.  ASSESSMENT AND PLAN:  A 74 year old male presenting with intermittent episodes of shortness of breath and lower extremity edema, will be admitted  with the following assessment and plan.  1.  Shortness of breath with no history of congestive heart failure associated with bilateral lower extremity edema.  We will provide him with IV Lasix.  We will continue aspirin.  Cardiology consult is placed to Dr. Lady Gary.  We will cycle cardiac biomarkers.  2.  Hyponatremia.  The patient is mentating fine.  We will repeat the sodium levels in a.m. and depending on the results we will do more work-up if needed.  3.  Bilateral lower extremity edema.  We will give Lasix IV and deep vein thrombosis is ruled out with lower extremity venous Dopplers.  4.  Hyperglycemia, probably steroid-induced.  Continue Decadron and check hemoglobin A1c.  We will also put the patient on sliding scale insulin.  5.  History of laryngeal cancer status post laryngectomy.  Follow up with oncology as an outpatient for possible chemotherapy.  6.  The patient has history of chronic obstructive pulmonary disease, stable.   The patient is a FULL CODE.     Diagnosis and plan of care  was discussed in detail with the patient and his wife at bedside.  They both expressed understanding of the plan.   TOTAL TIME SPENT ON THE ADMISSION:  50 minutes.    [facility]    ____________________________ Ramonita LabAruna Corney Knighton, MD ag:ea D: 12/11/2012 02:49:06 ET T: 12/11/2012 03:54:09 ET JOB#: [id number]  cc: Ramonita LabAruna Gisele Pack, MD, <Dictator> Ramonita LabARUNA Kylon Philbrook MD ELECTRONICALLY SIGNED 12/13/2012 22:49

## 2015-01-04 NOTE — Discharge Summary (Signed)
PATIENT NAME:  Mark [name], Mark [name] MR#:  [id number] DATE OF BIRTH:  1940/12/08  DATE OF ADMISSION:  12/11/2012 DATE OF DISCHARGE:  12/12/2012  DISCHARGE DIAGNOSES:  1.  Shortness of breath.  2.  Edema.  3.  Hyponatremia.   HISTORY OF PRESENT ILLNESS: Please see admission history and physical. Briefly, the patient became increasingly short of breath and had edema at home. He had recently cardiology and had a negative stress test. The patient was noted to be hyponatremic and have edema. He also had a lot of secretions at his trach site. These were suctioned and his shortness of breath improved. He was admitted for further management.   HOSPITAL COURSE BY ISSUE:  1.  Shortness of breath. This resolved after diuresis.  2.  Edema. He diuresed with IV Lasix quite well. He was not discharged on oral Lasix as he had potentially been a little over-diuresed. In fact his blood pressure was a little low prior to discharge so he was given 500 mL of saline before discharge. He was not having any dizziness, lightheadedness or chest pain. He will be seen as an outpatient to decide on further addition of a diuretic and/or decreasing his lisinopril.  3.  Hyponatremia. This was corrected. His free water flushes were held.  4.  FEN. He was changed to Jevity 1.5 with 2 cans at a time, 3 times daily, 6 cans a day. He also will resume free water flushes 200 mL t.i.d.   DISCHARGE MEDICATIONS: 1.  Lisinopril 40 mg once a day.  2.  Klor-Con 20 milliequivalents 1 tablet twice a day.  3  Alprazolam 0.25 mg t.i.d. as needed.  4.  Dexamethasone 4 mg 3 times a day via PEG tube.  5.  Acetaminophen/hydrocodone 325/5 mg 1 tablet every 6 hours as needed for pain.  6.  Advil 200 mg 2 capsules twice a day as needed.  7.  Aspirin 81 mg a day.  8.  Pepcid 20 milliequivalents once a day.  9.  Benadryl 25 mg t.i.d. as needed for bladder spasms.  10.  Jevity 480 mL 3 times a day.   DISPOSITION: Discharged to home with home health  for PEG tube management. The patient will follow up with Dr. Sampson GoonFitzgerald in 1 to 2 weeks' time.   TIME SPENT:  35 minutes.  ____________________________ Stann Mainlandavid P. Sampson GoonFitzgerald, MD dpf:sb D: 12/12/2012 13:09:49 ET T: 12/12/2012 13:22:38 ET JOB#: [id number]  cc: Stann Mainlandavid P. Sampson GoonFitzgerald, MD, <Dictator> Erron Wengert Sampson GoonFITZGERALD MD ELECTRONICALLY SIGNED 12/14/2012 15:43

## 2015-01-06 NOTE — Op Note (Signed)
PATIENT NAME:  Mark [name], Mark [name] MR#:  [id number] DATE OF BIRTH:  1940/11/14  DATE OF PROCEDURE:  02/15/2012  PREOPERATIVE DIAGNOSES:  1. History of squamous cell carcinoma of the larynx, status post total laryngectomy.  2. Bleeding from the oral cavity.   POSTOPERATIVE DIAGNOSES:  1. History of squamous cell carcinoma of the larynx, status post total laryngectomy.  2. Bleeding from the oral cavity.   PROCEDURE PERFORMED:  Microlaryngoscopy with biopsies base of tongue.   SURGEON: Mark Facereighton Tyshun Tuckerman, MD   ANESTHESIA: General endotracheal anesthesia.   ESTIMATED BLOOD LOSS: Minimal.   IV FLUIDS: Please see anesthesia record.   COMPLICATIONS: None.   DRAINS/STENT PLACEMENTS: None.   SPECIMENS: Base of tongue biopsy.   INDICATIONS FOR PROCEDURE: The patient is a 74 year old male with a history of squamous cell carcinoma of the larynx, status post chemotherapy, radiation, and total laryngectomy, found to have some bleeding from his oral cavity over the last several weeks. Examination was very limited in the office and Emergency Room.    OPERATIVE FINDINGS: Extremely friable mucosa throughout the patient's oral cavity, pharynx and neopharynx. Firm area of the base of tongue which was biopsied.   DESCRIPTION OF PROCEDURE: After the patient was identified in holding, the benefits and risks of the procedure were discussed and consent was reviewed. The patient was taken to the Operating Room and placed in the supine position. General endotracheal anesthesia was induced through the patient's existing trachea stoma, and the patient was rotated 90 degrees. A Dedo laryngoscope was inserted in the patient's oral cavity for visualization of the patient's oral cavity. This demonstrated extremely friable mucosa with bleeding from the corners of his lips as well as the side of his mouth bilaterally. Further evaluation of his base of tongue revealed a very firm area of his base of tongue that is friable to  palpation with a small amount of active oozing. Visualization of the patient's neopharynx revealed some postsurgical changes but no abnormal masses or lesions. 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 At this time, 45-degree up-biting cup forceps were taken, and multiple biopsies were taken for the midline base of  tongue in the area of the firmness, and hemostasis was achieved with topical pledgets. At this time after hemostasis was achieved, care of the patient was transferred to Anesthesia where the patient tolerated the procedure well and was taken to the PAC-U in good condition.  ____________________________ Mark Ruddreighton C. Mauri Temkin, MD ccv:cbb D: 02/15/2012 08:01:46 ET T: 02/15/2012 10:41:19 ET JOB#: [id number]  cc: Mark Ruddreighton C. Joycelyn Liska, MD, <Dictator> Mark RuddREIGHTON C Boston Cookson MD ELECTRONICALLY SIGNED 02/17/2012 13:41

## 2015-01-06 NOTE — H&P (Signed)
PATIENT NAME:  Mark [name], Mark [name] MR#:  [id number] DATE OF BIRTH:  1941/02/22  DATE OF ADMISSION:  03/09/2012  PRIMARY CARE PHYSICIAN: Roe Coombson C. Candelaria Stagershaplin, MD    ER REFERRING PHYSICIAN: Glennie IsleSheryl Gottlieb, MD   CHIEF COMPLAINT: Abdominal pain and diarrhea.   HISTORY OF PRESENT ILLNESS: The patient is a 74 year old African American male with history of laryngeal carcinoma, status post laryngectomy. He also has an open tracheostomy, also has a PEG tube through which he gets his tube feeds and medications. He does not take any oral medications and does not take food. He was hospitalized in August 2012 with similar type of pain with abdominal pain and diarrhea. The patient presented with the same type of complaints for the past few days. He states that abdominal pain is sharp in nature and in the epigastric region. By the time I saw him, it already improved. The patient earlier had an abdominal x-ray which showed findings concerning for small bowel obstruction; therefore, a surgical consult was obtained as well as a CT scan of the abdomen was done. CT scan of the abdomen shows that he has a large amount of stool present in the rectosigmoid as well as some mild colonic dilation. He has not had any fevers or chills, has not been nauseous or throwing up. He is complaining of some loose bowel movements. Otherwise, he denies any shortness of breath, chest pains or palpitations.   PAST MEDICAL/SURGICAL HISTORY:  1. History of cancer of the larynx, status post laryngectomy more than 11 years ago. [facility]. The patient has a recent pathology from oral cavity lesion that was bleeding. It suggests recurrence of invasive squamous cell carcinoma. This pathology report is from 02/16/2012.  2. Hypertension.  3. Hypothyroidism.  4. Anxiety.  5. Chronic obstructive pulmonary disease.  6. History of sigmoid diverticulosis.  7. Previous hospitalizations required for electrolyte imbalances due to poor p.o. intake.     CURRENT MEDICATIONS:  1. Alprazolam 0.25, 1 tab p.o. every 8 hours p.r.n. anxiety.  2. Amlodipine 5 mg daily.  3. Aspirin 81 mg, 1 tab p.o. daily.  4. Hydrochlorothiazide 25 mg daily.  5. Klor-Con 20 mEq, 1 tab p.o. daily.  6. Levothyroxine 125 mcg daily.  7. Lisinopril 20 mg daily.   SOCIAL HISTORY: He used to smoke but does not currently. He denies drinking, but from his previous hospitalization they stated that he occasionally pours alcohol down his PEG tube.   FAMILY HISTORY: Diabetes in sister, hypertension in mother.    REVIEW OF SYSTEMS: CONSTITUTIONAL: Denies any fevers. Complains of some fatigue, weakness, abdominal pain as above. No weight loss or weight gain. EYES: No blurred or double vision. No redness. No inflammation. No glaucoma. No cataracts. ENT: No tinnitus. No ear pain. No hearing loss. No seasonal or year-round allergies. No epistaxis. He is unable to swallow. RESPIRATORY: Denies any cough, wheezing. No hemoptysis. Has history of chronic obstructive pulmonary disease. CARDIOVASCULAR: No chest pain. No orthopnea. No edema. No arrhythmia. GASTROINTESTINAL: Complains of some diarrhea, epigastric abdominal pain. No hematemesis. No hematochezia. GENITOURINARY: Denies any dysuria, hematuria. ENDOCRINE: Denies any polyuria, nocturia, or thyroid problems. HEME/LYMPH: Denies anemia, easy bruisability, or bleeding. SKIN: No acne. No rash. No change in mole, hair or skin. MUSCULOSKELETAL: Denies any pain in the neck, back, or shoulder. NEUROLOGICAL:   No numbness. No  cerebrovascular accident. No transient ischemic attack. PSYCHIATRIC: No insomnia. No ADD.  He does have some anxiety.   PHYSICAL EXAMINATION:  VITAL SIGNS:  Temperature 96.2, pulse 81, respirations 20, blood pressure 137/96, O2 96%.   GENERAL: The patient is a very thin-looking African American male who appears chronically debilitated.   HEENT: Head atraumatic. Pupils are equally round and reactive to light and  accommodation. Extraocular movements are intact. There is no scleral icterus. No conjunctivus. Oral mucosa is slightly dry. No pallor.   NECK: He has an open tracheostomy. There is no thyroid tenderness or enlargement. Neck is supple. No masses, no adenopathy, no JVD.   CARDIOVASCULAR: Regular rate and rhythm. No murmurs, rubs, clicks, or gallops. PMI is not displaced.   LUNGS: Clear to auscultation bilaterally without any rales, rhonchi, or wheezing.   ABDOMEN: Soft. There is currently no tenderness. No guarding or rebound. PEG tube in place without any drainage.   SKIN: He has no rash.   LYMPHATICS: No lymph nodes palpable.   MUSCULOSKELETAL: There is no erythema or swelling.   VASCULAR: Good DP, PT pulses.   PSYCHIATRIC: Currently not anxious or depressed.   NEUROLOGICAL: Awake, alert, oriented x3. No focal deficits.   LABORATORY, DIAGNOSTIC AND RADIOLOGICAL DATA:  In the Emergency Department, CT of the abdomen and pelvis showed multiple noncalcified stones versus gallbladder polyps, abdominal aortic aneurysm and bilateral common iliac aneurysms, mild colonic distention. This could be related to the large amount of stool present in the rectosigmoid.  WBC 4.7, hemoglobin 13, platelet count 242. Glucose 168, BUN 11, creatinine 1.06. Sodium was severely decreased at 120. Potassium is 2.5, chloride is 77.  LFTs showed bilirubin total 1.1, AST is 56, lipase 171.  Urinalysis shows leukocytes 1+.  TSH 4.47.    ASSESSMENT AND PLAN: The patient is a 74 year old with history of laryngeal cancer with possible recurrence, status post laryngectomy, has an open trach, has a PEG tube present, with abdominal pain.  1. Abdominal pain: Likely due to severe constipation. At this time we will give him laxatives and a one-time Fleet's Enema. We will follow his GI symptoms. He has already been seen by Surgery. There is no surgical intervention needed at this point.  2. Diarrhea: Likely due to overflow  of liquid around his stools from constipation.  3. Hyponatremia, hypokalemia: Likely due to volume depletion. We will replace his electrolytes with IV fluids and normal saline.  4. Hypertension: Continue his home regimen with amlodipine, hydrochlorothiazide. Hydralazine p.r.n.  for elevated blood pressure.  5. Prophylaxis: We will place him on Lovenox for deep vein thrombosis prophylaxis.   TIME SPENT: 40 minutes.  ____________________________ Lacie Scotts Allena Katz, MD shp:cbb D: 03/09/2012 17:36:33 ET T: 03/09/2012 18:52:15 ET JOB#: [id number]  cc: Tamaria Dunleavy [name]. Allena Katz, MD, <Dictator> Jimmie Molly. Candelaria Stagers, MD Charise Carwin MD ELECTRONICALLY SIGNED 03/10/2012 16:39

## 2015-01-06 NOTE — Consult Note (Signed)
PATIENT NAME:  Mark [name], Mark [name] MR#:  [id number] DATE OF BIRTH:  26-Nov-1940  DATE OF CONSULTATION:  01/25/2012  REFERRING PHYSICIAN:  Janalyn Harder, MD   CONSULTING PHYSICIAN:  Kyung Rudd, MD  REASON FOR CONSULTATION: Bleeding from oral cavity.   HISTORY OF PRESENT ILLNESS: The patient is a 74 year old male with history of squamous cell carcinoma of the larynx, status post a total laryngectomy many years ago. 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 He was seen in my office several weeks ago for evaluation of his dysphagia following a barium swallow which did not reveal any passage through the esophagus. He reports a long-standing history of stenosis and was told at [facility] to "never let anyone try to dilate him." He awoke his wife this evening with bleeding from his oral cavity. He denies any bleeding from his mouth. He denies any attempt to eat any substance and otherwise reports he is okay. He was evaluated in the Emergency Room and was found to have increase in some blood in his posterior oropharynx, and I was asked to evaluate.   PAST MEDICAL HISTORY:  1. Hypothyroidism. 2. History of laryngeal cancer.  3. Hypertension.   PAST SURGICAL HISTORY:  1. Laryngectomy. 2. PEG tube.  3. Multiple esophageal dilations in the past.   SOCIAL HISTORY: Tobacco: The patient has a history of tobacco abuse but none current. Alcohol: The patient does intermittently use alcohol through his PEG tube.   CURRENT MEDICATIONS: Reglan, Synthroid, potassium, hydrochlorothiazide, aspirin.   FAMILY HISTORY: Noncontributory.     PHYSICAL EXAMINATION:  VITAL SIGNS: Pulse 78, blood pressure 129/88, temperature is 98.1. Pulse oximetry is 98%.   GENERAL: The  patient is lying down supine in no acute distress.    EARS: EACs are clear bilaterally. TMs are intact with no perforation or effusion.   NOSE: Nose reveals some boggy mucosa and some clear  drainage but no purulence.   ORAL CAVITY AND OROPHARYNX: Some pooling of secretions and some old dried blood but no bright red bleeding.   NECK: Exam reveals some postsurgical and postradiation changes and a trachea stoma which is widely patent.   PROCEDURE: Transnasal flexible laryngoscopy.   PREPROCEDURE DIAGNOSIS: Bleeding from oral cavity with history of total laryngectomy.   POSTPROCEDURE DIAGNOSIS: Bleeding from oral cavity with history of total laryngectomy.   DESCRIPTION OF PROCEDURE: After verbal consent was obtained, the patient's nasal cavities were anesthetized with Afrin, and transnasal flexible laryngoscopy was performed. This demonstrated no abnormal masses or lesions or areas of bleeding in the patient's nasal cavity or nasopharynx.  The neopharynx had some pooling of secretions but no bright red blood. Exam was limited secondary to the patient's tolerance.   IMPRESSION: Bleeding from the oral cavity with no evidence of active bleeding now.  PLAN: He just had some old blood pooling in his oral cavity since he is unable to swallow. I did instruct the patient and his wife for him to swish some water around to clear this out. I do not see any current source of bleeding, and I had just evaluated him several weeks ago and did not see any evidence at that time either. I have recommended followup as an outpatient with me later on this week, and I will then further evaluate him.     If he continues to have periods of bleeding, he may need evaluation in the Operating Room just to make sure that there is nothing more ominous  going on given his history of radiation. ____________________________ Kyung Ruddreighton C. Basya Casavant, MD ccv:cbb D: 01/26/2012 08:11:00 ET T: 01/26/2012 10:37:22 ET JOB#: [id number]  cc: Kyung Ruddreighton C. Riven Mabile, MD, <Dictator> Kyung RuddREIGHTON C Francys Bolin MD ELECTRONICALLY SIGNED 02/01/2012 7:54

## 2015-01-06 NOTE — Discharge Summary (Signed)
PATIENT NAME:  Mark [name], Mark [name] MR#:  [id number] DATE OF BIRTH:  [birth year]  DATE OF ADMISSION:  03/09/2012 DATE OF DISCHARGE:  03/11/2012   HOSPITAL COURSE: The patient is a 74 year old man who presented to the Emergency Room with severe abdominal pain and diarrhea history. Currently he is under some active evaluation for recurrent throat cancer. Evaluation is being done at [facility] under the supervision of Dr. Tami Ribas here in [facility]. The patient has had some previous history of abdominal pains and difficulties. 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 He was seen urgently in consultation by Dr. Burt Knack of General Surgery who felt this was not a surgical intervention issue. His CAT scan study suggested this is more of a fecal impaction.   His initial assessment found profound electrolyte imbalances with glucose 168, BUN 11, creatinine 1.06 with sodium down to 120 and potassium down to 2.5, chloride 74 with eGFR greater than 60. His liver function studies, SGOT mildly elevated at 56, SGPT of 50, bilirubin 1.1, total protein 7.9, albumin 4. History of hypothyroid with a stimulating hormone level normal at 4.47. Electrolytes white count was 4700, hemoglobin 13.3. Urinalysis was unremarkable. Electrocardiogram was normal sinus mechanism.   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 Stool was noted in the colon. Diverticulosis was noted in the sigmoid colon and in the left colon. The colon is slightly distended. Large amount of stool is noted in the rectosigmoid area causing some colonic dilatation. Finding initially with a three-way of the abdomen showed small bowel obstruction appearance.   At the time of discharge, his sugar was 99, BUN 14, creatinine 1.0, sodium 132, potassium 3.6, chloride 95.   During the 48 hours he received IV normal saline with potassium  supplement. Some 12 hours after admission he was able to tolerate his tube feedings without difficulty. He was seen by physical therapy and the patient ambulated quite well without significant difficulty.   His HCT was initially held. His blood pressure remained stable without any orthostatic changes and at the time of discharge blood pressure was running 146/95 standing, 163/89 laying, 147/100 sitting. His HCTZ is being restarted. Blood pressure at discharge 144/80, afebrile, pulse is normal.   DISCHARGE DIAGNOSES:  1. Abdominal pain secondary to fecal impaction. 2. 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  3. Hypothyroid, stable.  4. Hypertension, stable.  5. Recurrent throat cancer under active management by [facility] and being seen by Dr. Beverly Gust at [facility].  6. The patient resumed his home medications plus the addition of Colace 100 mg b.i.d. and the addition of MiraLAX 17 grams one dose daily to maintain good bowel prep.   FOLLOW-UP: The patient already has a follow-up appointment scheduled to see me in the office in a few weeks which he is to keep.   ____________________________ Dianah Field. Mable Fill, MD dcc:drc D: 03/11/2012 07:20:50 ET T: 03/11/2012 07:50:28 ET JOB#: [id number]  cc: Dianah Field. Mable Fill, MD, <Dictator> Bridget [place] Sadie Haber MD ELECTRONICALLY SIGNED 03/22/2012 8:07

## 2015-01-06 NOTE — Consult Note (Signed)
Brief Consult Note: Diagnosis: pSBO resolving.   Patient was seen by consultant.   Consult note dictated.   Recommend further assessment or treatment.   Orders entered.   Discussed with Attending MD.   Comments: pain nearly resolved, BM this am, no vomiting will review CT doubt high  grade obs. needs adm for low K and Na. will follow.  Electronic Signatures: Lattie Hawooper, Kenzo Ozment E (MD)  (Signed 26-Jun-13 12:22)  Authored: Brief Consult Note   Last Updated: 26-Jun-13 12:22 by Lattie Hawooper, Terin Cragle E (MD)
